# Patient Record
Sex: Male | Born: 1965 | Race: Black or African American | Hispanic: No | Marital: Single | Smoking: Current some day smoker
Health system: Southern US, Community
[De-identification: ages and names within clinical notes are randomized; demographics above are authoritative.]

## PROBLEM LIST (undated history)

## (undated) DIAGNOSIS — E785 Hyperlipidemia, unspecified: Secondary | ICD-10-CM

## (undated) DIAGNOSIS — I10 Essential (primary) hypertension: Secondary | ICD-10-CM

## (undated) DIAGNOSIS — S0990XA Unspecified injury of head, initial encounter: Secondary | ICD-10-CM

## (undated) DIAGNOSIS — Q046 Congenital cerebral cysts: Secondary | ICD-10-CM

## (undated) HISTORY — PX: SHOULDER SURGERY: SHX246

## (undated) HISTORY — PX: TOE FUSION: SHX1070

---

## 2006-11-28 ENCOUNTER — Emergency Department: Payer: Self-pay | Admitting: Emergency Medicine

## 2010-03-19 ENCOUNTER — Emergency Department: Payer: Self-pay | Admitting: Unknown Physician Specialty

## 2011-03-01 ENCOUNTER — Emergency Department: Payer: Self-pay | Admitting: Emergency Medicine

## 2014-06-06 ENCOUNTER — Observation Stay: Payer: Self-pay | Admitting: Urology

## 2014-06-06 LAB — CBC WITH DIFFERENTIAL/PLATELET
BASOS ABS: 0 10*3/uL (ref 0.0–0.1)
Basophil %: 0 %
Eosinophil #: 0 10*3/uL (ref 0.0–0.7)
Eosinophil %: 0.5 %
HCT: 46.3 % (ref 40.0–52.0)
HGB: 14.6 g/dL (ref 13.0–18.0)
Lymphocyte #: 0.4 10*3/uL — ABNORMAL LOW (ref 1.0–3.6)
Lymphocyte %: 3.9 %
MCH: 25.4 pg — ABNORMAL LOW (ref 26.0–34.0)
MCHC: 31.5 g/dL — ABNORMAL LOW (ref 32.0–36.0)
MCV: 81 fL (ref 80–100)
MONOS PCT: 4.4 %
Monocyte #: 0.4 x10 3/mm (ref 0.2–1.0)
Neutrophil #: 8.6 10*3/uL — ABNORMAL HIGH (ref 1.4–6.5)
Neutrophil %: 91.2 %
Platelet: 148 10*3/uL — ABNORMAL LOW (ref 150–440)
RBC: 5.75 10*6/uL (ref 4.40–5.90)
RDW: 15.3 % — AB (ref 11.5–14.5)
WBC: 9.5 10*3/uL (ref 3.8–10.6)

## 2014-06-06 LAB — LIPASE, BLOOD: LIPASE: 67 U/L — AB (ref 73–393)

## 2014-06-06 LAB — URINALYSIS, COMPLETE
Bacteria: NONE SEEN
Bilirubin,UR: NEGATIVE
GLUCOSE, UR: NEGATIVE mg/dL (ref 0–75)
NITRITE: POSITIVE
PH: 8 (ref 4.5–8.0)
Protein: NEGATIVE
RBC,UR: 3 /HPF (ref 0–5)
Specific Gravity: 1.023 (ref 1.003–1.030)
WBC UR: 15 /HPF (ref 0–5)

## 2014-06-06 LAB — COMPREHENSIVE METABOLIC PANEL
ALK PHOS: 83 U/L
ANION GAP: 5 — AB (ref 7–16)
Albumin: 3.9 g/dL (ref 3.4–5.0)
BUN: 12 mg/dL (ref 7–18)
Bilirubin,Total: 0.6 mg/dL (ref 0.2–1.0)
Calcium, Total: 8.6 mg/dL (ref 8.5–10.1)
Chloride: 108 mmol/L — ABNORMAL HIGH (ref 98–107)
Co2: 26 mmol/L (ref 21–32)
Creatinine: 1.21 mg/dL (ref 0.60–1.30)
Glucose: 103 mg/dL — ABNORMAL HIGH (ref 65–99)
Osmolality: 278 (ref 275–301)
Potassium: 4.8 mmol/L (ref 3.5–5.1)
SGOT(AST): 24 U/L (ref 15–37)
SGPT (ALT): 27 U/L (ref 12–78)
Sodium: 139 mmol/L (ref 136–145)
Total Protein: 7.6 g/dL (ref 6.4–8.2)

## 2014-06-06 LAB — TROPONIN I: Troponin-I: <0.02 ng/mL

## 2014-06-08 LAB — URINE CULTURE

## 2015-04-01 NOTE — Op Note (Signed)
PATIENT NAME:  Jesus Hawkins, Jesus Hawkins MR#:  [id number] DATE OF BIRTH:  [birth year]  DATE OF PROCEDURE:  06/06/2014  PREOPERATIVE DIAGNOSES: 1.  Urinary retention.  2.  Urethral stricture disease.   POSTOPERATIVE DIAGNOSES: 1.  Urinary retention.  2.  Urethral stricture disease.   PROCEDURES: 1.  Cystoscopy with visual internal urethrotomy.  2.  Foley catheter placement.   SURGEON: Maryan Puls, M.D.   ANESTHETIST: Dr. Boston Service  ANESTHETIC METHOD: General per Dr. Boston Service and local per Dr. Yves Dill.   INDICATIONS: See the dictated history and physical. After informed consent, the patient requested the above procedure.   OPERATIVE SUMMARY: After adequate general anesthesia had been obtained, the patient was placed into dorsal lithotomy position and the perineum was prepped and draped in the usual fashion. The 21-French cystoscope was coupled with the camera and then visually advanced into the distal urethra. The scope could only be advanced up to the distal bulbar urethra at which point a pinpoint opening was encountered due to presence of a stricture. A 0.035 guidewire was then threaded through the stricture and curled into the bladder. The cystoscope was removed. The visual urethrotome was then coupled with the camera and advanced up to the stricture. Using the urethrotome the stricture was incised at the 12 o'clock position eventually allowing entry into the bladder. The bladder was thoroughly inspected. Both ureteral orifices were identified and had clear efflux. No bladder mucosal lesions were identified. At this point, the urethrotome was removed taking care to leave the guidewire in position. A 20-French Council catheter was then advanced over the guidewire and placed into the bladder. Guidewire was then removed. Catheter had clear drainage. Then 10 mL of viscous Xylocaine was instilled within the urethra around the Foley catheter. B and O suppository was placed. The procedure was then  terminated, and the patient was transferred to the recovery room in stable condition. ____________________________ Otelia Limes. Yves Dill, MD [email or web address]:[id number] D: 06/06/2014 14:49:55 ET T: 06/06/2014 15:04:40 ET JOB#: [id number]  cc: Otelia Limes. Yves Dill, MD, <Dictator> Royston Cowper MD ELECTRONICALLY SIGNED 06/07/2014 8:34

## 2015-04-01 NOTE — Consult Note (Signed)
PATIENT NAME:  Jesus Hawkins, Jesus Hawkins MR#:  [id number] DATE OF BIRTH:  Jan 20, 1966  DATE OF CONSULTATION:  06/06/2014  REFERRING PHYSICIAN:  Emergency Room  CONSULTING PHYSICIAN:  Otelia Limes. Yves Dill, MD  REASON FOR CONSULTATION: Inability to place Foley catheter.   HISTORY OF PRESENT ILLNESS: Jesus Hawkins is a 49 year old [place] male who presented to the ER with suprapubic discomfort and a long history of difficulty voiding. He states that he may have had a Foley catheter placed some time in the past, but does not recall the circumstances around that. He denies prior prostate or urethral surgery. CT scan was performed in the ER revealing distended bladder and thickened bladder wall with possible bladder stone.  ALLERGIES: No drug allergies.   CURRENT MEDICATIONS: Niacin and fish oil.  PAST SURGICAL HISTORY: Repair of a right foot deformity in 1996 and repair of injured right shoulder in 2010.   SOCIAL HISTORY: Smokes a pack a day, has a 40 pack-year history. Consumes 2 to 3 alcoholic beverages per week.   FAMILY HISTORY: Negative for urologic disease and prostate cancer.  PAST AND CURRENT MEDICAL CONDITIONS: The patient denied any significant medical history.  REVIEW OF SYSTEMS: The patient denied heart disease, lung disease, stroke, diabetes or hypertension.   PHYSICAL EXAMINATION: GENERAL: Well-nourished African American male in no distress.  HEENT: Sclerae were clear. Pupils were equally round and reactive to light and accommodation.  NECK: Supple. No palpable cervical adenopathy.  LUNGS: Clear to auscultation.  CARDIOVASCULAR: Regular rhythm and rate without audible murmurs.  ABDOMEN: Soft, nontender abdomen.  GENITOURINARY: Uncircumcised. Testes atrophic, 18 mL in size each.  RECTAL: 15 grams, smooth, nontender prostate.  NEUROMUSCULAR: Alert and oriented x3.   IMPRESSION: 1.  Urinary retention.  2.  Trabeculated bladder with possible bladder stone.   PLAN: 1.  Perineum was prepped and  draped in the usual fashion. Then 20 mL of 1% Xylocaine was instilled within the urethra. After adequate local anesthesia had been obtained, an attempt was made to place an 18-French coude catheter. The patient appeared to have a stricture in the proximal penile urethra. Coude could not be passed beyond this point. At this point, a filiform was placed, but the stricture could not be dilated with followers. The procedure was then terminated.  2.  Will plan to take the patient to the operating room for cystoscopy with internal urethrotomy with catheter placement versus suprapubic catheter placement under anesthesia. ____________________________ Otelia Limes. Yves Dill, MD [email or web address] D: 06/06/2014 08:20:31 ET T: 06/06/2014 08:35:57 ET JOB#: [id number]  cc: Otelia Limes. Yves Dill, MD, <Dictator> Jesus Cowper MD ELECTRONICALLY SIGNED 06/06/2014 13:12

## 2015-10-12 ENCOUNTER — Encounter: Payer: Self-pay | Admitting: *Deleted

## 2015-10-13 ENCOUNTER — Encounter
Admission: RE | Disposition: A | Discharge status: Home / Self Care | Payer: Self-pay | Source: Ambulatory Visit | Attending: Gastroenterology

## 2015-10-13 ENCOUNTER — Ambulatory Visit: Payer: Medicare Other | Admitting: Anesthesiology

## 2015-10-13 ENCOUNTER — Encounter: Payer: Self-pay | Admitting: *Deleted

## 2015-10-13 ENCOUNTER — Ambulatory Visit
Admission: RE | Admit: 2015-10-13 | Discharge: 2015-10-13 | Disposition: A | Discharge status: Home / Self Care | Payer: Medicare Other | Source: Ambulatory Visit | Attending: Gastroenterology | Admitting: Gastroenterology

## 2015-10-13 DIAGNOSIS — K635 Polyp of colon: Secondary | ICD-10-CM | POA: Diagnosis not present

## 2015-10-13 DIAGNOSIS — Z1211 Encounter for screening for malignant neoplasm of colon: Secondary | ICD-10-CM | POA: Insufficient documentation

## 2015-10-13 DIAGNOSIS — I10 Essential (primary) hypertension: Secondary | ICD-10-CM | POA: Diagnosis not present

## 2015-10-13 DIAGNOSIS — Z79899 Other long term (current) drug therapy: Secondary | ICD-10-CM | POA: Insufficient documentation

## 2015-10-13 DIAGNOSIS — D125 Benign neoplasm of sigmoid colon: Secondary | ICD-10-CM | POA: Diagnosis not present

## 2015-10-13 DIAGNOSIS — E785 Hyperlipidemia, unspecified: Secondary | ICD-10-CM | POA: Diagnosis not present

## 2015-10-13 HISTORY — DX: Essential (primary) hypertension: I10

## 2015-10-13 HISTORY — DX: Unspecified injury of head, initial encounter: S09.90XA

## 2015-10-13 HISTORY — DX: Hyperlipidemia, unspecified: E78.5

## 2015-10-13 HISTORY — PX: COLONOSCOPY WITH PROPOFOL: SHX5780

## 2015-10-13 SURGERY — COLONOSCOPY WITH PROPOFOL
Anesthesia: General

## 2015-10-13 MED ORDER — SODIUM CHLORIDE 0.9 % IV SOLN
INTRAVENOUS | Status: DC
  Administered 2015-10-13: 1000 mL via INTRAVENOUS

## 2015-10-13 MED ORDER — PHENYLEPHRINE HCL 10 MG/ML IJ SOLN
INTRAMUSCULAR | Status: DC | PRN
  Administered 2015-10-13: 100 ug via INTRAVENOUS
  Administered 2015-10-13: 50 ug via INTRAVENOUS

## 2015-10-13 MED ORDER — PROPOFOL 500 MG/50ML IV EMUL
INTRAVENOUS | Status: DC | PRN
  Administered 2015-10-13: 160 ug/kg/min via INTRAVENOUS

## 2015-10-13 MED ORDER — MIDAZOLAM HCL 5 MG/5ML IJ SOLN
INTRAMUSCULAR | Status: DC | PRN
  Administered 2015-10-13: 1 mg via INTRAVENOUS

## 2015-10-13 MED ORDER — FENTANYL CITRATE (PF) 100 MCG/2ML IJ SOLN
INTRAMUSCULAR | Status: DC | PRN
Start: 2015-10-13 — End: 2015-10-13
  Administered 2015-10-13: 50 ug via INTRAVENOUS

## 2015-10-13 MED ORDER — SODIUM CHLORIDE 0.9 % IV SOLN: INTRAVENOUS | Status: DC

## 2015-10-13 MED ORDER — PROPOFOL 10 MG/ML IV BOLUS
INTRAVENOUS | Status: DC | PRN
  Administered 2015-10-13: 50 mg via INTRAVENOUS
  Administered 2015-10-13: 25 mg via INTRAVENOUS

## 2015-10-13 NOTE — H&P (Signed)
Outpatient short stay form Pre-procedure 10/13/2015 9:39 AM Lollie Sails MD  Primary Physician: Gwenevere Ghazi M.D., general medical clinic Dr. Herbert Moors  Reason for visit:  Colonoscopy  History of present illness:  Patient is a 49 year old male residing today for screening colonoscopy. He tolerated his prep well. He denies use of any aspirin or blood thinning products.    Current facility-administered medications:  .  0.9 %  sodium chloride infusion, , Intravenous, Continuous, Lollie Sails, MD .  0.9 %  sodium chloride infusion, , Intravenous, Continuous, Lollie Sails, MD, Last Rate: 20 mL/hr at 10/13/15 0916, 1,000 mL at 10/13/15 0916 .  0.9 %  sodium chloride infusion, , Intravenous, Continuous, Lollie Sails, MD  Prescriptions prior to admission  Medication Sig Dispense Refill Last Dose  . amLODipine (NORVASC) 5 MG tablet Take 5 mg by mouth daily.     . niacin 500 MG tablet Take 500 mg by mouth at bedtime.     . OMEGA-3 FATTY ACIDS-VITAMIN E PO Take 1,000 mg by mouth 2 (two) times daily.        No Known Allergies   Past Medical History  Diagnosis Date  . Head injury   . Hyperlipidemia   . Hypertension     Review of systems:      Physical Exam    Heart and lungs: Regular rate and rhythm without rub or gallop, lungs are bilaterally clear    HEENT: Normocephalic atraumatic eyes are anicteric    Other:     Pertinant exam for procedure: Soft nontender nondistended bowel sounds positive normoactive    Planned proceedures: Colonoscopy and indicated procedures. I have discussed the risks benefits and complications of procedures to include not limited to bleeding, infection, perforation and the risk of sedation and the patient wishes to proceed.    Lollie Sails, MD Gastroenterology 10/13/2015  9:39 AM

## 2015-10-13 NOTE — Anesthesia Postprocedure Evaluation (Signed)
  Anesthesia Post-op Note  Patient: Jesus Hawkins  Procedure(s) Performed: Procedure(s): COLONOSCOPY WITH PROPOFOL (N/A)  Anesthesia type:General  Patient location: PACU  Post pain: Pain level controlled  Post assessment: Post-op Vital signs reviewed, Patient's Cardiovascular Status Stable, Respiratory Function Stable, Patent Airway and No signs of Nausea or vomiting  Post vital signs: Reviewed and stable  Last Vitals:  Filed Vitals:   10/13/15 1100  BP: 111/85  Pulse: 68  Temp:   Resp: 16    Level of consciousness: awake, alert  and patient cooperative  Complications: No apparent anesthesia complications

## 2015-10-13 NOTE — Op Note (Signed)
[facility] Gastroenterology Patient Name: Jesus Hawkins Procedure Date: 10/13/2015 9:42 AM MRN: [id number] Account #: [id number] Date of Birth: 29-Aug-1966 Admit Type: Outpatient Age: 49 Room: [facility] ENDO ROOM 3 Gender: Male Note Status: Finalized Procedure:         Colonoscopy Indications:       Screening for colorectal malignant neoplasm Providers:         Lollie Sails, MD Referring MD:      Ardelia Mems. Jimmye Norman (Referring MD) Medicines:         Monitored Anesthesia Care Complications:     No immediate complications. Procedure:         Pre-Anesthesia Assessment:                    - ASA Grade Assessment: III - A patient with severe                     systemic disease.                    After obtaining informed consent, the colonoscope was                     passed under direct vision. Throughout the procedure, the                     patient's blood pressure, pulse, and oxygen saturations                     were monitored continuously. The Colonoscope was                     introduced through the anus and advanced to the the cecum,                     identified by appendiceal orifice and ileocecal valve. The                     quality of the bowel preparation was good. Findings:      A 3 mm polyp was found at the hepatic flexure. The polyp was sessile.       The polyp was removed with a cold biopsy forceps. Resection and       retrieval were complete.      A 2 mm polyp was found in the distal sigmoid colon. The polyp was       sessile. The polyp was removed with a cold biopsy forceps. Resection and       retrieval were complete.      The retroflexed view of the distal rectum and anal verge was normal and       showed no anal or rectal abnormalities.      The digital rectal exam was normal. Impression:        - One 3 mm polyp at the hepatic flexure. Resected and                     retrieved.                    - One 2 mm polyp in the distal sigmoid  colon. Resected and                     retrieved.                    -  The distal rectum and anal verge are normal on                     retroflexion view. Recommendation:    - Telephone GI clinic for pathology results in 1 week.                    - Await pathology results. Procedure Code(s): --- Professional ---                    [phone], Colonoscopy, flexible; with biopsy, single or                     multiple Diagnosis Code(s): --- Professional ---                    V76.51, Special screening for malignant neoplasms of colon                    211.3, Benign neoplasm of colon CPT copyright 2014 American Medical Association. All rights reserved. The codes documented in this report are preliminary and upon coder review may  be revised to meet current compliance requirements. Lollie Sails, MD 10/13/2015 10:23:31 AM This report has been signed electronically. Number of Addenda: 0 Note Initiated On: 10/13/2015 9:42 AM Scope Withdrawal Time: 0 hours 5 minutes 37 seconds  Total Procedure Duration: 0 hours 16 minutes 14 seconds       [facility]

## 2015-10-13 NOTE — Anesthesia Preprocedure Evaluation (Signed)
Anesthesia Evaluation  Patient identified by MRN, date of birth, ID band Patient awake    Reviewed: Allergy & Precautions, H&P , NPO status , Patient's Chart, lab work & pertinent test results  History of Anesthesia Complications Negative for: history of anesthetic complications  Airway Mallampati: II  TM Distance: >3 FB Neck ROM: full    Dental  (+) Poor Dentition, Missing, Upper Dentures   Pulmonary neg shortness of breath, Current Smoker,    Pulmonary exam normal breath sounds clear to auscultation       Cardiovascular Exercise Tolerance: Good hypertension, (-) angina(-) DOE Normal cardiovascular exam(-) Valvular Problems/Murmurs Rhythm:regular Rate:Normal     Neuro/Psych negative neurological ROS  negative psych ROS   GI/Hepatic negative GI ROS, Neg liver ROS, neg GERD  ,  Endo/Other  negative endocrine ROS  Renal/GU negative Renal ROS  negative genitourinary   Musculoskeletal   Abdominal   Peds  Hematology negative hematology ROS (+)   Anesthesia Other Findings Past Medical History:   Head injury                                                  Hyperlipidemia                                               Hypertension                                                Past Surgical History:   SHOULDER SURGERY                                Right              TOE FUSION                                                   BMI    Body Mass Index   23.09 kg/m 2    Weakness on right side of body from CP  Reproductive/Obstetrics negative OB ROS                             Anesthesia Physical Anesthesia Plan  ASA: III  Anesthesia Plan: General   Post-op Pain Management:    Induction:   Airway Management Planned:   Additional Equipment:   Intra-op Plan:   Post-operative Plan:   Informed Consent: I have reviewed the patients History and Physical, chart, labs and discussed the  procedure including the risks, benefits and alternatives for the proposed anesthesia with the patient or authorized representative who has indicated his/her understanding and acceptance.   Dental Advisory Given  Plan Discussed with: Anesthesiologist, CRNA and Surgeon  Anesthesia Plan Comments:         Anesthesia Quick Evaluation

## 2015-10-13 NOTE — Transfer of Care (Cosign Needed)
Immediate Anesthesia Transfer of Care Note  Patient: Jesus Hawkins  Procedure(s) Performed: Procedure(s): COLONOSCOPY WITH PROPOFOL (N/A)  Patient Location: PACU and Endoscopy Unit  Anesthesia Type:General  Level of Consciousness: sedated  Airway & Oxygen Therapy: Patient Spontanous Breathing and Patient connected to nasal cannula oxygen  Post-op Assessment: Report given to RN and Post -op Vital signs reviewed and stable  Post vital signs: Reviewed and stable  Last Vitals:  Filed Vitals:   10/13/15 1029  BP:   Pulse:   Temp: 36.3 C  Resp:     Complications: No apparent anesthesia complications

## 2015-10-16 ENCOUNTER — Encounter: Payer: Self-pay | Admitting: Gastroenterology

## 2015-10-16 LAB — SURGICAL PATHOLOGY

## 2017-06-07 ENCOUNTER — Emergency Department
Admission: EM | Admit: 2017-06-07 | Discharge: 2017-06-07 | Disposition: A | Discharge status: Home / Self Care | Payer: Medicare Other | Attending: Emergency Medicine | Admitting: Emergency Medicine

## 2017-06-07 DIAGNOSIS — H9201 Otalgia, right ear: Secondary | ICD-10-CM | POA: Diagnosis present

## 2017-06-07 DIAGNOSIS — Z5321 Procedure and treatment not carried out due to patient leaving prior to being seen by health care provider: Secondary | ICD-10-CM | POA: Insufficient documentation

## 2017-06-07 NOTE — ED Triage Notes (Signed)
Pt states he thinks he has a bug in his right ear. Pt with some sanginous drainage in right ear. Pt does not appear to be in any acute distress.

## 2017-06-07 NOTE — ED Notes (Signed)
Pt states he is leaving "the bug came out".

## 2019-01-04 ENCOUNTER — Emergency Department: Payer: Medicare Other

## 2019-01-04 ENCOUNTER — Other Ambulatory Visit: Payer: Self-pay

## 2019-01-04 ENCOUNTER — Emergency Department
Admission: EM | Admit: 2019-01-04 | Discharge: 2019-01-04 | Disposition: A | Discharge status: Home / Self Care | Payer: Medicare Other | Attending: Emergency Medicine | Admitting: Emergency Medicine

## 2019-01-04 DIAGNOSIS — F17210 Nicotine dependence, cigarettes, uncomplicated: Secondary | ICD-10-CM | POA: Insufficient documentation

## 2019-01-04 DIAGNOSIS — I10 Essential (primary) hypertension: Secondary | ICD-10-CM | POA: Diagnosis not present

## 2019-01-04 DIAGNOSIS — N390 Urinary tract infection, site not specified: Secondary | ICD-10-CM | POA: Insufficient documentation

## 2019-01-04 DIAGNOSIS — Z79899 Other long term (current) drug therapy: Secondary | ICD-10-CM | POA: Diagnosis not present

## 2019-01-04 DIAGNOSIS — R51 Headache: Secondary | ICD-10-CM | POA: Diagnosis present

## 2019-01-04 DIAGNOSIS — R42 Dizziness and giddiness: Secondary | ICD-10-CM | POA: Insufficient documentation

## 2019-01-04 LAB — GLUCOSE, CAPILLARY: GLUCOSE-CAPILLARY: 106 mg/dL — AB (ref 70–99)

## 2019-01-04 LAB — CBC
HCT: 44.6 % (ref 39.0–52.0)
Hemoglobin: 14.5 g/dL (ref 13.0–17.0)
MCH: 25.6 pg — ABNORMAL LOW (ref 26.0–34.0)
MCHC: 32.5 g/dL (ref 30.0–36.0)
MCV: 78.8 fL — ABNORMAL LOW (ref 80.0–100.0)
Platelets: 206 10*3/uL (ref 150–400)
RBC: 5.66 MIL/uL (ref 4.22–5.81)
RDW: 15 % (ref 11.5–15.5)
WBC: 7.6 10*3/uL (ref 4.0–10.5)
nRBC: 0 % (ref 0.0–0.2)

## 2019-01-04 LAB — ETHANOL: Alcohol, Ethyl (B): <10 mg/dL (ref <10)

## 2019-01-04 LAB — BASIC METABOLIC PANEL
Anion gap: 5 (ref 5–15)
BUN: 15 mg/dL (ref 6–20)
CO2: 25 mmol/L (ref 22–32)
Calcium: 9.3 mg/dL (ref 8.9–10.3)
Chloride: 107 mmol/L (ref 98–111)
Creatinine, Ser: 1.1 mg/dL (ref 0.61–1.24)
GFR calc Af Amer: >60 mL/min (ref >60)
GFR calc non Af Amer: >60 mL/min (ref >60)
Glucose, Bld: 135 mg/dL — ABNORMAL HIGH (ref 70–99)
POTASSIUM: 5.1 mmol/L (ref 3.5–5.1)
Sodium: 137 mmol/L (ref 135–145)

## 2019-01-04 LAB — URINALYSIS, COMPLETE (UACMP) WITH MICROSCOPIC
Bilirubin Urine: NEGATIVE
Glucose, UA: NEGATIVE mg/dL
KETONES UR: NEGATIVE mg/dL
Nitrite: POSITIVE — AB
Protein, ur: NEGATIVE mg/dL
Specific Gravity, Urine: 1.011 (ref 1.005–1.030)
WBC, UA: >50 WBC/hpf — ABNORMAL HIGH (ref 0–5)
pH: 8 (ref 5.0–8.0)

## 2019-01-04 LAB — URINE DRUG SCREEN, QUALITATIVE (ARMC ONLY)
AMPHETAMINES, UR SCREEN: NOT DETECTED
BENZODIAZEPINE, UR SCRN: NOT DETECTED
Barbiturates, Ur Screen: NOT DETECTED
Cannabinoid 50 Ng, Ur ~~LOC~~: POSITIVE — AB
Cocaine Metabolite,Ur ~~LOC~~: NOT DETECTED
MDMA (Ecstasy)Ur Screen: NOT DETECTED
Methadone Scn, Ur: NOT DETECTED
Opiate, Ur Screen: NOT DETECTED
Phencyclidine (PCP) Ur S: NOT DETECTED
Tricyclic, Ur Screen: NOT DETECTED

## 2019-01-04 LAB — INFLUENZA PANEL BY PCR (TYPE A & B)
Influenza A By PCR: NEGATIVE
Influenza B By PCR: NEGATIVE

## 2019-01-04 MED ORDER — CEPHALEXIN 500 MG PO CAPS: 500.0000 mg | ORAL_CAPSULE | Freq: Two times a day (BID) | ORAL | 0 refills | Status: AC

## 2019-01-04 MED ORDER — CEPHALEXIN 500 MG PO CAPS
500.0000 mg | ORAL_CAPSULE | Freq: Once | ORAL | Status: AC
  Administered 2019-01-04: 500 mg via ORAL
  Filled 2019-01-04: qty 1

## 2019-01-04 MED ORDER — SODIUM CHLORIDE 0.9 % IV BOLUS
500.0000 mL | Freq: Once | INTRAVENOUS | Status: AC
  Administered 2019-01-04: 500 mL via INTRAVENOUS

## 2019-01-04 MED ORDER — METOCLOPRAMIDE HCL 5 MG/ML IJ SOLN
10.0000 mg | Freq: Once | INTRAMUSCULAR | Status: AC
  Administered 2019-01-04: 10 mg via INTRAVENOUS
  Filled 2019-01-04: qty 2

## 2019-01-04 NOTE — ED Notes (Signed)
Pt non cooperative with answering questions. Mumbles in low voice when asking for any prior medical hx of why he is here.

## 2019-01-04 NOTE — ED Provider Notes (Signed)
[facility] Emergency Department Provider Note ____________________________________________   First MD Initiated Contact with Patient 01/04/19 [id number]     (approximate)  I have reviewed the triage vital signs and the nursing notes.   HISTORY  Chief Complaint Dizziness; Weakness; and Headache  Level 5 caveat: History of present illness limited due to poor historian  HPI MONTERIO BOB is a 53 y.o. male with PMH as noted below who presents with generalized weakness and dizziness as well as a frontal headache.  The onset was last night but it worsened this morning.  Per EMS the patient went to his neighbor's house and had his neighbor call EMS to come.  The patient reports some nausea and vomited twice but denies fever.  He denies any alcohol or drug use.   Past Medical History:  Diagnosis Date  . Head injury   . Hyperlipidemia   . Hypertension     There are no active problems to display for this patient.   Past Surgical History:  Procedure Laterality Date  . COLONOSCOPY WITH PROPOFOL N/A 10/13/2015   Procedure: COLONOSCOPY WITH PROPOFOL;  Surgeon: Lollie Sails, MD;  Location: [facility] ENDOSCOPY;  Service: Endoscopy;  Laterality: N/A;  . SHOULDER SURGERY Right   . TOE FUSION      Prior to Admission medications   Medication Sig Start Date End Date Taking? Authorizing Provider  atorvastatin (LIPITOR) 10 MG tablet Take 10 mg by mouth daily. 12/27/18  Yes [provider]  meloxicam (MOBIC) 15 MG tablet Take 15 mg by mouth daily. 10/13/18  Yes [provider]  niacin 500 MG tablet Take 500 mg by mouth daily.    Yes [provider]  OMEGA-3 FATTY ACIDS-VITAMIN E PO Take 1,000 mg by mouth 2 (two) times daily.   Yes [provider]  amLODipine (NORVASC) 5 MG tablet Take 5 mg by mouth daily.    [provider]  cephALEXin (KEFLEX) 500 MG capsule Take 1 capsule (500 mg total) by mouth 2 (two) times daily for 10 days.  01/04/19 01/14/19  Arta Silence, MD    Allergies Patient has no known allergies.  No family history on file.  Social History Social History   Tobacco Use  . Smoking status: Current Some Day Smoker    Packs/day: 1.00    Years: 10.00    Pack years: 10.00  . Smokeless tobacco: Never Used  Substance Use Topics  . Alcohol use: Yes    Alcohol/week: 3.0 standard drinks    Types: 3 Cans of beer per week  . Drug use: No    Review of Systems Level 5 caveat: Review of systems limited due to poor historian Constitutional: No fever.  Positive for weakness. Cardiovascular: Denies chest pain. Respiratory: Denies shortness of breath. Gastrointestinal: Positive for nausea. Skin: Negative for rash. Neurological: Positive for headache.   ____________________________________________   PHYSICAL EXAM:  VITAL SIGNS: ED Triage Vitals  Enc Vitals Group     BP 01/04/19 0857 130/75     Pulse Rate 01/04/19 0857 65     Resp 01/04/19 0857 12     Temp 01/04/19 0857 97.8 F (36.6 C)     Temp Source 01/04/19 0857 Oral     SpO2 01/04/19 0857 98 %     Weight 01/04/19 0854 200 lb (90.7 kg)     Height 01/04/19 0854 6' (1.829 m)     Head Circumference --      Peak Flow --  Pain Score --      Pain Loc --      Pain Edu? --      Excl. in [place]? --     Constitutional: Somnolent appearing but arousable.  No acute distress. Eyes: Conjunctivae are normal.  EOMI.  PERRLA. Head: Atraumatic. Nose: No congestion/rhinnorhea. Mouth/Throat: Mucous membranes are dry.   Neck: Normal range of motion.  Cardiovascular: Normal rate, regular rhythm. Grossly normal heart sounds.  Good peripheral circulation. Respiratory: Normal respiratory effort.  No retractions. Lungs CTAB. Gastrointestinal: Soft with minimal diffuse discomfort but no focal tenderness. No distention.  Genitourinary: No flank tenderness. Musculoskeletal: Extremities warm and well perfused.  Neurologic: Motor intact in all extremities  with some right-sided weakness which is baseline for the patient.  Normal coordination. Skin:  Skin is warm and dry. No rash noted. Psychiatric: Calm and cooperative.  ____________________________________________   LABS (all labs ordered are listed, but only abnormal results are displayed)  Labs Reviewed  GLUCOSE, CAPILLARY - Abnormal; Notable for the following components:      Result Value   Glucose-Capillary 106 (*)    All other components within normal limits  BASIC METABOLIC PANEL - Abnormal; Notable for the following components:   Glucose, Bld 135 (*)    All other components within normal limits  CBC - Abnormal; Notable for the following components:   MCV 78.8 (*)    MCH 25.6 (*)    All other components within normal limits  URINALYSIS, COMPLETE (UACMP) WITH MICROSCOPIC - Abnormal; Notable for the following components:   Color, Urine YELLOW (*)    APPearance CLOUDY (*)    Hgb urine dipstick SMALL (*)    Nitrite POSITIVE (*)    Leukocytes, UA SMALL (*)    WBC, UA >50 (*)    Bacteria, UA RARE (*)    All other components within normal limits  URINE DRUG SCREEN, QUALITATIVE ([facility] ONLY) - Abnormal; Notable for the following components:   Cannabinoid 50 Ng, Ur [place] POSITIVE (*)    All other components within normal limits  INFLUENZA PANEL BY PCR (TYPE A & B)  ETHANOL  CBG MONITORING, ED   ____________________________________________  EKG  ED ECG REPORT I, Arta Silence, the attending physician, personally viewed and interpreted this ECG.  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  53 year old male with PMH as noted above presents with weakness, nausea and vomiting, and frontal headache which she states started last night but worsened this morning.  Per EMS he went to his neighbor's house and called them.  He did not give much additional information en route.  On exam the patient appears somnolent but is arousable.  He is lying in the stretcher with his eyes closed although he is able to answer my questions.  He was able to comply with most of the exam; he appeared to give a  poor effort when squeezing hands, lifting his legs, or opening his eyes, but was able to do all of these things when I encouraged him.  His neuro exam is nonfocal except for chronic right-sided weakness.  There is no evidence of trauma.  The abdomen is soft with mild discomfort but no focal tenderness.  Overall the differential is not clear.  I suspect most likely benign etiology such as influenza or other viral syndrome, gastritis, or less likely other acute infection.  I have a low suspicion for CNS cause given that the patient's exam is nonfocal.  However given the difficulty in obtaining reliable history and exam we will obtain CT head, lab work-up, and then reassess.  ----------------------------------------- 12:11 PM on 01/04/2019 -----------------------------------------  The patient's work-up reveals findings consistent with a UTI, and on further questioning the patient does report that he has urinary urgency and frequency especially at night which has escalated recently.  The  remainder of his work-up is unremarkable including negative influenza.  His vital signs are stable.  On reassessment, he is much more alert and is now conversing normally.  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  CT shows no acute findings.  At this time, the patient is stable for discharge home and he is feeling well and would like to go home.  I counseled him on the results of the work-up.  We will start him on Keflex for empiric treatment of UTI.  The patient agrees with this plan.  Return precautions given, and he expresses understanding. ____________________________________________   FINAL CLINICAL IMPRESSION(S) / ED DIAGNOSES  Final diagnoses:  Urinary tract infection without hematuria, site unspecified      NEW MEDICATIONS STARTED DURING THIS VISIT:  New Prescriptions   CEPHALEXIN (KEFLEX) 500 MG CAPSULE    Take 1 capsule (500 mg total) by mouth 2 (two) times daily for 10 days.     Note:  This document was prepared using Dragon voice recognition software and may include unintentional dictation errors.    Arta Silence, MD 01/04/19 1213

## 2019-01-04 NOTE — ED Notes (Signed)
Patient transported to CT 

## 2019-01-04 NOTE — ED Notes (Signed)
Report to Sam, RN  

## 2019-01-04 NOTE — ED Triage Notes (Signed)
C/o dizziness, weakness and headache since last night. VSS.

## 2019-01-04 NOTE — Discharge Instructions (Addendum)
Your work-up shows a likely urinary tract infection.  Take the antibiotic as prescribed and finish the full course.  Return to the ER for new, worsening, or persistent weakness, lightheadedness, headache, fever, vomiting, or if you cannot take the medication.  Follow-up with your regular doctor.

## 2019-01-19 ENCOUNTER — Encounter: Payer: Self-pay | Admitting: *Deleted

## 2019-01-19 ENCOUNTER — Emergency Department
Admission: EM | Admit: 2019-01-19 | Discharge: 2019-01-20 | Disposition: A | Discharge status: Home / Self Care | Payer: Medicare Other | Attending: Emergency Medicine | Admitting: Emergency Medicine

## 2019-01-19 ENCOUNTER — Other Ambulatory Visit: Payer: Self-pay

## 2019-01-19 DIAGNOSIS — R110 Nausea: Secondary | ICD-10-CM | POA: Insufficient documentation

## 2019-01-19 DIAGNOSIS — R51 Headache: Secondary | ICD-10-CM | POA: Insufficient documentation

## 2019-01-19 DIAGNOSIS — I10 Essential (primary) hypertension: Secondary | ICD-10-CM | POA: Insufficient documentation

## 2019-01-19 DIAGNOSIS — Z79899 Other long term (current) drug therapy: Secondary | ICD-10-CM | POA: Diagnosis not present

## 2019-01-19 DIAGNOSIS — F17210 Nicotine dependence, cigarettes, uncomplicated: Secondary | ICD-10-CM | POA: Insufficient documentation

## 2019-01-19 DIAGNOSIS — R519 Headache, unspecified: Secondary | ICD-10-CM

## 2019-01-19 LAB — CBC
HCT: 40.7 % (ref 39.0–52.0)
Hemoglobin: 13.4 g/dL (ref 13.0–17.0)
MCH: 25.5 pg — ABNORMAL LOW (ref 26.0–34.0)
MCHC: 32.9 g/dL (ref 30.0–36.0)
MCV: 77.5 fL — ABNORMAL LOW (ref 80.0–100.0)
NRBC: 0 % (ref 0.0–0.2)
Platelets: 182 10*3/uL (ref 150–400)
RBC: 5.25 MIL/uL (ref 4.22–5.81)
RDW: 14.7 % (ref 11.5–15.5)
WBC: 7.9 10*3/uL (ref 4.0–10.5)

## 2019-01-19 LAB — COMPREHENSIVE METABOLIC PANEL
ALT: 50 U/L — ABNORMAL HIGH (ref 0–44)
AST: 37 U/L (ref 15–41)
Albumin: 4.4 g/dL (ref 3.5–5.0)
Alkaline Phosphatase: 72 U/L (ref 38–126)
Anion gap: 6 (ref 5–15)
BUN: 17 mg/dL (ref 6–20)
CO2: 26 mmol/L (ref 22–32)
Calcium: 9 mg/dL (ref 8.9–10.3)
Chloride: 104 mmol/L (ref 98–111)
Creatinine, Ser: 1.09 mg/dL (ref 0.61–1.24)
GFR calc Af Amer: >60 mL/min (ref >60)
GFR calc non Af Amer: >60 mL/min (ref >60)
Glucose, Bld: 134 mg/dL — ABNORMAL HIGH (ref 70–99)
POTASSIUM: 3.6 mmol/L (ref 3.5–5.1)
SODIUM: 136 mmol/L (ref 135–145)
Total Bilirubin: 0.4 mg/dL (ref 0.3–1.2)
Total Protein: 7.2 g/dL (ref 6.5–8.1)

## 2019-01-19 LAB — LIPASE, BLOOD: Lipase: 25 U/L (ref 11–51)

## 2019-01-19 MED ORDER — ONDANSETRON HCL 4 MG/2ML IJ SOLN
INTRAMUSCULAR | Status: AC
  Filled 2019-01-19: qty 2

## 2019-01-19 MED ORDER — SODIUM CHLORIDE 0.9% FLUSH
3.0000 mL | Freq: Once | INTRAVENOUS | Status: AC
  Administered 2019-01-19: 3 mL via INTRAVENOUS

## 2019-01-19 MED ORDER — ONDANSETRON HCL 4 MG/2ML IJ SOLN
4.0000 mg | Freq: Once | INTRAMUSCULAR | Status: AC
  Administered 2019-01-19: 4 mg via INTRAVENOUS

## 2019-01-19 NOTE — ED Triage Notes (Signed)
Pt to ED from home reporting vomiting that started today, dizziness and chills. No fever noted in triage but pt reports a fever at home today. Pt reports having taken a tylenol at ome today. Pt slow to respond in triage and holding emesis bag over face the entire time. 1 episode of diarrhea also reported.

## 2019-01-20 DIAGNOSIS — R51 Headache: Secondary | ICD-10-CM | POA: Diagnosis not present

## 2019-01-20 LAB — INFLUENZA PANEL BY PCR (TYPE A & B)
Influenza A By PCR: NEGATIVE
Influenza B By PCR: NEGATIVE

## 2019-01-20 MED ORDER — BUTALBITAL-APAP-CAFFEINE 50-325-40 MG PO TABS: 1.0000 | ORAL_TABLET | Freq: Four times a day (QID) | ORAL | 0 refills | Status: AC | PRN

## 2019-01-20 MED ORDER — METOCLOPRAMIDE HCL 5 MG/ML IJ SOLN
10.0000 mg | Freq: Once | INTRAMUSCULAR | Status: AC
  Administered 2019-01-20: 10 mg via INTRAVENOUS
  Filled 2019-01-20: qty 2

## 2019-01-20 MED ORDER — KETOROLAC TROMETHAMINE 30 MG/ML IJ SOLN
30.0000 mg | Freq: Once | INTRAMUSCULAR | Status: AC
  Administered 2019-01-20: 30 mg via INTRAVENOUS
  Filled 2019-01-20: qty 1

## 2019-01-20 NOTE — ED Provider Notes (Signed)
[facility] Emergency Department Provider Note    First MD Initiated Contact with Patient 01/19/19 2351     (approximate)  I have reviewed the triage vital signs and the nursing notes.   HISTORY  Chief Complaint Emesis and Dizziness    HPI Jesus Hawkins is a 53 y.o. male with below list of chronic medical conditions including head injury with chronic headache to the emergency department with current 5 out of 10 headache.  Patient denies any weakness numbness gait instability or visual changes.  Patient states that headache is consistent with previous headaches.  Patient does admit to nausea earlier however now resolved.   Past Medical History:  Diagnosis Date  . Head injury   . Hyperlipidemia   . Hypertension     There are no active problems to display for this patient.   Past Surgical History:  Procedure Laterality Date  . COLONOSCOPY WITH PROPOFOL N/A 10/13/2015   Procedure: COLONOSCOPY WITH PROPOFOL;  Surgeon: Lollie Sails, MD;  Location: [facility] ENDOSCOPY;  Service: Endoscopy;  Laterality: N/A;  . SHOULDER SURGERY Right   . TOE FUSION      Prior to Admission medications   Medication Sig Start Date End Date Taking? Authorizing Provider  amLODipine (NORVASC) 5 MG tablet Take 5 mg by mouth daily.    [provider]  atorvastatin (LIPITOR) 10 MG tablet Take 10 mg by mouth daily. 12/27/18   [provider]  meloxicam (MOBIC) 15 MG tablet Take 15 mg by mouth daily. 10/13/18   [provider]  niacin 500 MG tablet Take 500 mg by mouth daily.     [provider]  OMEGA-3 FATTY ACIDS-VITAMIN E PO Take 1,000 mg by mouth 2 (two) times daily.    [provider]    Allergies Patient has no known allergies.  History reviewed. No pertinent family history.  Social History Social History   Tobacco Use  . Smoking status: Current Some Day Smoker    Packs/day: 1.00    Years: 10.00    Pack years: 10.00  .  Smokeless tobacco: Never Used  Substance Use Topics  . Alcohol use: Yes    Alcohol/week: 3.0 standard drinks    Types: 3 Cans of beer per week  . Drug use: No    Review of Systems Constitutional: No fever/chills Eyes: No visual changes. ENT: No sore throat. Cardiovascular: Denies chest pain. Respiratory: Denies shortness of breath. Gastrointestinal: No abdominal pain.  No nausea, no vomiting.  No diarrhea.  No constipation. Genitourinary: Negative for dysuria. Musculoskeletal: Negative for neck pain.  Negative for back pain. Integumentary: Negative for rash. Neurological: Positive for headaches, negative for focal weakness or numbness.   ____________________________________________   PHYSICAL EXAM:  VITAL SIGNS: ED Triage Vitals [01/19/19 2135]  Enc Vitals Group     BP 124/88     Pulse Rate 71     Resp 16     Temp 97.9 F (36.6 C)     Temp Source Oral     SpO2 100 %     Weight 90.7 kg (200 lb)     Height 1.829 m (6')     Head Circumference      Peak Flow      Pain Score 10     Pain Loc      Pain Edu?      Excl. in [place]?     Constitutional: Alert and oriented. Well appearing and in no acute distress. Eyes:  Conjunctivae are normal. PERRL. EOMI. Head: Atraumatic. Ears:  Healthy appearing ear canals and TMs bilaterally Nose: No congestion/rhinnorhea. Mouth/Throat: Mucous membranes are moist.  Oropharynx non-erythematous. Neck: No stridor.  Cardiovascular: Normal rate, regular rhythm. Good peripheral circulation. Grossly normal heart sounds. Respiratory: Normal respiratory effort.  No retractions. Lungs CTAB. Gastrointestinal: Soft and nontender. No distention.  Musculoskeletal: No lower extremity tenderness nor edema. No gross deformities of extremities. Neurologic:  Normal speech and language. No gross focal neurologic deficits are appreciated.  Skin:  Skin is warm, dry and intact. No rash noted. Psychiatric: Mood and affect are normal. Speech and behavior are  normal.  ____________________________________________   LABS (all labs ordered are listed, but only abnormal results are displayed)  Labs Reviewed  COMPREHENSIVE METABOLIC PANEL - Abnormal; Notable for the following components:      Result Value   Glucose, Bld 134 (*)    ALT 50 (*)    All other components within normal limits  CBC - Abnormal; Notable for the following components:   MCV 77.5 (*)    MCH 25.5 (*)    All other components within normal limits  LIPASE, BLOOD  INFLUENZA PANEL BY PCR (TYPE A & B)  URINALYSIS, COMPLETE (UACMP) WITH MICROSCOPIC  CBG MONITORING, ED     Procedures   ____________________________________________   INITIAL IMPRESSION / ASSESSMENT AND PLAN / ED COURSE  As part of my medical decision making, I reviewed the following data within the electronic MEDICAL RECORD [id number] year old male presented with above-stated history and physical exam of headache consistent with previous headaches.  Patient had a CT scan done on January 27 which revealed chronic findings nothing acute.  Given that patient has no focal neurological deficits repeats scan not performed.  Patient given Toradol and Reglan in the emergency department will be prescribed Fioricet for home with recommendation to follow-up with primary care provider. ____________________________________________  FINAL CLINICAL IMPRESSION(S) / ED DIAGNOSES  Final diagnoses:  None     MEDICATIONS GIVEN DURING THIS VISIT:  Medications  sodium chloride flush (NS) 0.9 % injection 3 mL (3 mLs Intravenous Given 01/19/19 2144)  ondansetron (ZOFRAN) injection 4 mg (4 mg Intravenous Given 01/19/19 2144)  ketorolac (TORADOL) 30 MG/ML injection 30 mg (30 mg Intravenous Given 01/20/19 0034)  metoCLOPramide (REGLAN) injection 10 mg (10 mg Intravenous Given 01/20/19 0034)     ED Discharge Orders    None       Note:  This document was prepared using Dragon voice recognition software and may include unintentional  dictation errors.   Gregor Hams, MD 01/20/19 [id number]

## 2019-02-17 ENCOUNTER — Other Ambulatory Visit: Payer: Self-pay

## 2019-02-17 ENCOUNTER — Encounter: Payer: Self-pay | Admitting: Urology

## 2019-02-17 ENCOUNTER — Ambulatory Visit (INDEPENDENT_AMBULATORY_CARE_PROVIDER_SITE_OTHER): Payer: Medicare Other | Admitting: Urology

## 2019-02-17 VITALS — BP 126/73 | HR 87 | Ht 72.0 in | Wt 208.0 lb

## 2019-02-17 DIAGNOSIS — N401 Enlarged prostate with lower urinary tract symptoms: Secondary | ICD-10-CM

## 2019-02-17 DIAGNOSIS — R3914 Feeling of incomplete bladder emptying: Secondary | ICD-10-CM

## 2019-02-17 DIAGNOSIS — N35912 Unspecified bulbous urethral stricture, male: Secondary | ICD-10-CM | POA: Diagnosis not present

## 2019-02-17 DIAGNOSIS — R3129 Other microscopic hematuria: Secondary | ICD-10-CM

## 2019-02-17 LAB — MICROSCOPIC EXAMINATION

## 2019-02-17 LAB — URINALYSIS, COMPLETE
Bilirubin, UA: NEGATIVE
Glucose, UA: NEGATIVE
Ketones, UA: NEGATIVE
Nitrite, UA: POSITIVE — AB
Specific Gravity, UA: 1.02 (ref 1.005–1.030)
Urobilinogen, Ur: 0.2 mg/dL (ref 0.2–1.0)
pH, UA: 7.5 (ref 5.0–7.5)

## 2019-02-17 LAB — BLADDER SCAN AMB NON-IMAGING: Scan Result: >671

## 2019-02-17 MED ORDER — TAMSULOSIN HCL 0.4 MG PO CAPS: 0.4000 mg | ORAL_CAPSULE | Freq: Every day | ORAL | 11 refills | Status: DC

## 2019-02-17 NOTE — Progress Notes (Signed)
02/17/2019 8:47 AM   Jesus Hawkins 1966/08/10 [id number]  Referring provider: Tracie Harrier, MD [address] [facility] [place], [place] [address]  Chief Complaint  Patient presents with  . Hematuria    HPI:  Jesus Hawkins is a 53 yo male referred for microscopic hematuria.  Urinalysis January 11, 2019 revealed 10-50 red blood cells per high-powered field.  He's had no gross hematuria. He is a smoker. Urine cx mixed growth Jul 2019.   He doesn't completely empty his bladder. CT scan from 2015 revealed a distended bladder with an irregular appearing wall such as cellules or trabeculation. Prostate mild BPH. He underwent DVIU with Dr. Yves Dill for a distal bulbar urethra pinpoint opening.   His February 2020 BUN was 17, creatinine 1.09 with a GFR greater than 60. PVR today is 671 ml. He voids sometimes frequently. He strains sometimes strains to void and has a weak stream.   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 As a result his right side is weaker.   Modifying factors: There are no other modifying factors  Associated signs and symptoms: There are no other associated signs and symptoms Aggravating and relieving factors: There are no other aggravating or relieving factors Severity: Moderate Duration: Persistent    PMH: Past Medical History:  Diagnosis Date  . Head injury   . Hyperlipidemia   . Hypertension     Surgical History: Past Surgical History:  Procedure Laterality Date  . COLONOSCOPY WITH PROPOFOL N/A 10/13/2015   Procedure: COLONOSCOPY WITH PROPOFOL;  Surgeon: Lollie Sails, MD;  Location: [facility] ENDOSCOPY;  Service: Endoscopy;  Laterality: N/A;  . SHOULDER SURGERY Right   . TOE FUSION      Home Medications:  Allergies as of 02/17/2019   No Known Allergies     Medication List       Accurate as of February 17, 2019  8:47 AM. Always use your most  recent med list.        amLODipine 5 MG tablet Commonly known as:  NORVASC Take 5 mg by mouth daily.   atorvastatin 10 MG tablet Commonly known as:  LIPITOR Take 10 mg by mouth daily.   butalbital-acetaminophen-caffeine 50-325-40 MG tablet Commonly known as:  FIORICET, ESGIC Take 1 tablet by mouth every 6 (six) hours as needed for headache.   meloxicam 15 MG tablet Commonly known as:  MOBIC Take 15 mg by mouth daily.   niacin 500 MG tablet Take 500 mg by mouth daily.   OMEGA-3 FATTY ACIDS-VITAMIN E PO Take 1,000 mg by mouth 2 (two) times daily.       Allergies: No Known Allergies  Family History: History reviewed. No pertinent family history.  Social History:  reports that he has been smoking. He has a 10.00 pack-year smoking history. He has never used smokeless tobacco. He reports current alcohol use of about 3.0 standard drinks of alcohol per week. He reports that he does not use drugs.  ROS: UROLOGY Frequent Urination?: No Hard to postpone urination?: No Burning/pain with urination?: No Get up at night to urinate?: No Leakage of urine?: No Urine stream starts and stops?: Yes Trouble starting stream?: Yes Do you have to strain to urinate?: No Blood in urine?: No Urinary tract infection?: No Sexually transmitted disease?: No Injury to kidneys or bladder?: No Painful intercourse?: No Weak stream?: Yes Erection problems?: No Penile pain?: No  Gastrointestinal Nausea?: No Vomiting?: No Indigestion/heartburn?: No  Diarrhea?: No Constipation?: No  Constitutional Fever: No Night sweats?: No Weight loss?: No Fatigue?: No  Skin Skin rash/lesions?: No Itching?: No  Eyes Blurred vision?: No Double vision?: No  Ears/Nose/Throat Sore throat?: No Sinus problems?: No  Hematologic/Lymphatic Swollen glands?: No Easy bruising?: No  Cardiovascular Leg swelling?: No Chest pain?: No  Respiratory Cough?: No Shortness of breath?: No  Endocrine  Excessive thirst?: Yes  Musculoskeletal Back pain?: No Joint pain?: No  Neurological Headaches?: No Dizziness?: No  Psychologic Depression?: No Anxiety?: No  Physical Exam: BP 126/73 (BP Location: Left Arm, Patient Position: Sitting, Cuff Size: Normal)   Pulse 87   Ht 6' (1.829 m)   Wt 94.3 kg   BMI 28.21 kg/m   Constitutional:  Alert and oriented, No acute distress. HEENT: [place] AT, moist mucus membranes.  Trachea midline, no masses. Cardiovascular: No clubbing, cyanosis, or edema. Respiratory: Normal respiratory effort, no increased work of breathing. GI: Abdomen is soft, nontender, nondistended, no abdominal masses GU: No CVA tenderness Lymph: No cervical or inguinal lymphadenopathy. Skin: No rashes, bruises or suspicious lesions. Neurologic: Grossly intact, no focal deficits, moving all 4 extremities. Psychiatric: Normal mood and affect. GU: uncircumcised penis, normal foreskin, no phimosis. Scrotum normal, testicles palpably normal, left atrophy / smaller.  DRE: prostate smooth, no hard area or nodule, 50 g   Laboratory Data: Lab Results  Component Value Date   WBC 7.9 01/19/2019   HGB 13.4 01/19/2019   HCT 40.7 01/19/2019   MCV 77.5 (L) 01/19/2019   PLT 182 01/19/2019    Lab Results  Component Value Date   CREATININE 1.09 01/19/2019    No results found for: PSA  No results found for: TESTOSTERONE  No results found for: HGBA1C  Urinalysis    Component Value Date/Time   COLORURINE YELLOW (A) 01/04/2019 0900   APPEARANCEUR CLOUDY (A) 01/04/2019 0900   APPEARANCEUR Hazy 06/06/2014 0554   LABSPEC 1.011 01/04/2019 0900   LABSPEC 1.023 06/06/2014 0554   PHURINE 8.0 01/04/2019 0900   GLUCOSEU NEGATIVE 01/04/2019 0900   GLUCOSEU Negative 06/06/2014 0554   HGBUR SMALL (A) 01/04/2019 0900   BILIRUBINUR NEGATIVE 01/04/2019 0900   BILIRUBINUR Negative 06/06/2014 0554   KETONESUR NEGATIVE 01/04/2019 0900   PROTEINUR NEGATIVE 01/04/2019 0900   NITRITE  POSITIVE (A) 01/04/2019 0900   LEUKOCYTESUR SMALL (A) 01/04/2019 0900   LEUKOCYTESUR Trace 06/06/2014 0554    Lab Results  Component Value Date   BACTERIA RARE (A) 01/04/2019    Pertinent Imaging: CT 2015  No results found for this or any previous visit. No results found for this or any previous visit. No results found for this or any previous visit. No results found for this or any previous visit. No results found for this or any previous visit. No results found for this or any previous visit. No results found for this or any previous visit. No results found for this or any previous visit.  Assessment & Plan:    1. Microscopic hematuria Set up another CT scan and cystoscopy - discussed with patient  - Urinalysis, Complete - Bladder Scan (Post Void Residual) in office  2. Benign prostatic hyperplasia, unspecified whether lower urinary tract symptoms present Discussed addition of tamsulosin, Rx sent.  - Bladder Scan (Post Void Residual) in office  3. 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   No follow-ups on file.  Festus Aloe, MD  [facility]  [address], [place], [place] [address] [phone]

## 2019-02-23 LAB — CULTURE, URINE COMPREHENSIVE

## 2019-02-24 ENCOUNTER — Telehealth: Payer: Self-pay

## 2019-02-24 MED ORDER — SULFAMETHOXAZOLE-TRIMETHOPRIM 800-160 MG PO TABS: 1.0000 | ORAL_TABLET | Freq: Two times a day (BID) | ORAL | 0 refills | Status: DC

## 2019-02-24 NOTE — Telephone Encounter (Signed)
Per Dr. Junious Silk patient has a positive urine culture and needs to start Bactrim DS BID #14. Patient was notified of this and script was sent to pharmacy

## 2019-03-10 ENCOUNTER — Other Ambulatory Visit: Payer: Medicare Other | Admitting: Urology

## 2019-04-02 ENCOUNTER — Ambulatory Visit: Admission: RE | Admit: 2019-04-02 | Payer: Medicare Other | Source: Ambulatory Visit

## 2019-04-07 ENCOUNTER — Other Ambulatory Visit: Payer: Medicare Other | Admitting: Urology

## 2019-04-19 ENCOUNTER — Ambulatory Visit: Admission: RE | Admit: 2019-04-19 | Payer: Medicare Other | Source: Ambulatory Visit

## 2019-05-17 ENCOUNTER — Telehealth: Payer: Self-pay | Admitting: Urology

## 2019-05-17 ENCOUNTER — Other Ambulatory Visit: Payer: Self-pay

## 2019-05-17 ENCOUNTER — Ambulatory Visit
Admission: RE | Admit: 2019-05-17 | Discharge: 2019-05-17 | Disposition: A | Discharge status: Home / Self Care | Payer: Medicare Other | Source: Ambulatory Visit | Attending: Urology | Admitting: Urology

## 2019-05-17 DIAGNOSIS — R3129 Other microscopic hematuria: Secondary | ICD-10-CM | POA: Diagnosis not present

## 2019-05-17 LAB — POCT I-STAT CREATININE: Creatinine, Ser: 1.3 mg/dL — ABNORMAL HIGH (ref 0.61–1.24)

## 2019-05-17 MED ORDER — CIPROFLOXACIN HCL 500 MG PO TABS: 500.0000 mg | ORAL_TABLET | Freq: Two times a day (BID) | ORAL | 0 refills | Status: DC

## 2019-05-17 MED ORDER — IOHEXOL 300 MG/ML  SOLN
150.0000 mL | Freq: Once | INTRAMUSCULAR | Status: AC | PRN
  Administered 2019-05-17: 100 mL via INTRAVENOUS

## 2019-05-17 NOTE — Telephone Encounter (Signed)
Start Cipro 500 mg po BID in preparation for cysto Wednesday.

## 2019-05-17 NOTE — Telephone Encounter (Signed)
-----   Message from Royanne Foots, Cucumber sent at 05/17/2019 10:27 AM EDT -----  ----- Message ----- From: Interface, Rad Results In Sent: 05/17/2019  10:13 AM EDT To: Rowe [facility]

## 2019-05-17 NOTE — Telephone Encounter (Signed)
Left pt mess to call 

## 2019-05-17 NOTE — Telephone Encounter (Signed)
Pt. Called returning a call from the office, asserted that no message was left. Informed the pt it may be with respect to Dr. Lyndal Rainbow message that the pt. Needed to start Cipro. Pt. Asked me to call their caregiver because he did not understand what I was saying. Called caregiver at [phone], if caregiver calls back please let them know that the pt is to start cipro immediately for cysto on 05/19/19

## 2019-05-19 ENCOUNTER — Ambulatory Visit (INDEPENDENT_AMBULATORY_CARE_PROVIDER_SITE_OTHER): Payer: Medicare Other | Admitting: Urology

## 2019-05-19 ENCOUNTER — Telehealth: Payer: Self-pay | Admitting: Family Medicine

## 2019-05-19 ENCOUNTER — Other Ambulatory Visit: Payer: Self-pay

## 2019-05-19 ENCOUNTER — Encounter: Payer: Self-pay | Admitting: Urology

## 2019-05-19 VITALS — BP 107/71 | HR 80 | Ht 72.0 in | Wt 207.5 lb

## 2019-05-19 DIAGNOSIS — N35819 Other urethral stricture, male, unspecified site: Secondary | ICD-10-CM | POA: Diagnosis not present

## 2019-05-19 DIAGNOSIS — R3129 Other microscopic hematuria: Secondary | ICD-10-CM

## 2019-05-19 DIAGNOSIS — R339 Retention of urine, unspecified: Secondary | ICD-10-CM | POA: Diagnosis not present

## 2019-05-19 LAB — URINALYSIS, COMPLETE
Bilirubin, UA: NEGATIVE
Glucose, UA: NEGATIVE
Ketones, UA: NEGATIVE
Nitrite, UA: NEGATIVE
Specific Gravity, UA: 1.02 (ref 1.005–1.030)
Urobilinogen, Ur: 0.2 mg/dL (ref 0.2–1.0)
pH, UA: 8 — ABNORMAL HIGH (ref 5.0–7.5)

## 2019-05-19 LAB — MICROSCOPIC EXAMINATION
RBC, Urine: >30 /hpf — AB (ref 0–2)
WBC, UA: >30 /hpf — AB (ref 0–5)

## 2019-05-19 NOTE — Patient Instructions (Signed)
Urethral Stricture  Urethral stricture is narrowing of the tube (urethra) that carries urine from the bladder out of the body. The urethra can become narrow due to scar tissue from an injury or infection. This can make it difficult to pass urine. In women, the urethra opens above the vaginal opening. In men, the urethra opens at the tip of the penis, and the urethra is much longer than it is in women. Because of the length of the male urethra, urethral stricture is much more common in men. This condition is treated with surgery. What are the causes? Common causes of urethral stricture in men and women include:  Urinary tract infection (UTI).  Sexually transmitted infection (STI).  Use of a tube placed into the urethra to drain urine from the bladder (urinary catheter).  Urinary tract surgery. In men, common causes of urethral stricture include:  A severe injury to the pelvis.  Prostate surgery.  Injury to the penis. In many cases, the cause of urethral stricture may not be known. What increases the risk? Urethral stricture is more likely to develop in:  Men, especially men who have had prostate surgery.  People who use urinary catheters.  People who have had urinary tract surgery. What are the signs or symptoms? The most common symptom of this condition is difficulty passing urine. This may cause decreased urine flow, dribbling, or spraying of urine. Other symptoms may include:  Frequent UTIs.  Blood in the urine.  Pain when urinating.  Swelling of the penis in men.  Inability to pass urine (urinary obstruction). How is this diagnosed? This condition may be diagnosed based on:  Your medical history.  A physical exam.  Urine tests to check for infection or bleeding.  X-rays.  Ultrasound.  Retrograde urethrogram. This is a type of test in which dye is injected into the urethra and then an X-ray is taken.  Urethroscopy. This is when a thin tube with a light and  camera on the end (urethroscope) is used to look at the urethra. How is this treated? This condition is treated with surgery. The type of surgery that you have depends on the severity of your condition. You may have:  Urethral dilation. In this procedure, the narrow part of the urethra is stretched open (dilated) with dilating instruments or a small balloon.  Urethrotomy. In this procedure, a urethroscope is placed into the urethra, and the narrow part of the urethra is cut open with a surgical blade inserted through the urethroscope.  Open surgery. In this procedure, an incision is made in the urethra, the narrow part is removed, and the urethra is reconstructed. Follow these instructions at home:   Take over-the-counter and prescription medicines only as told by your health care provider.  If you were prescribed an antibiotic medicine, take it as told by your health care provider. Do not stop taking the antibiotic even if you start to feel better.  Drink enough fluid to keep your urine clear or pale yellow.  Keep all follow-up visits as told by your health care provider. This is important. Contact a health care provider if:  You have signs of a urinary tract infection, such as: ? Frequent urination or passing small amounts of urine frequently. ? Needing to urinate urgently. ? Pain or burning with urination. ? Urine that smells bad or unusual. ? Cloudy urine. ? Pain in the lower abdomen or back. ? Trouble urinating. ? Blood in the urine. ? Vomiting or being less hungry than  normal. ? Diarrhea or abdominal pain. ? Vaginal discharge, if you are male.  Your symptoms are getting worse instead of better. Get help right away if:  You cannot pass urine.  You have a fever.  You have swelling, bruising, or discoloration of your genital area. This includes the penis, scrotum, and inner thighs for men, and the outer genital organs (vulva) and inner thighs for women.  You develop  swelling in your legs.  You have difficulty breathing. This information is not intended to replace advice given to you by your health care provider. Make sure you discuss any questions you have with your health care provider. Document Released: 12/22/2015 Document Revised: 05/02/2016 Document Reviewed: 11/12/2015 Elsevier Interactive Patient Education  2019 [employer].

## 2019-05-19 NOTE — Telephone Encounter (Signed)
Left message for Caregiver Darlene at [phone] to call office back. She needs to be informed patient is getting a referral to go to [facility] for a stricture.

## 2019-05-19 NOTE — Progress Notes (Signed)
05/19/2019 10:10 AM   Jesus Hawkins [birth year] [id number]  Referring provider: Tracie Harrier, MD [address] [facility] [place],  [address]  Chief Complaint  Patient presents with  . Cysto    HPI:  Jesus Hawkins is a 53 yo male who follows up for a couple of issues --   1) microscopic hematuria -  Urinalysis January 11, 2019 revealed 10-50 red blood cells per high-powered field.  He's had no gross hematuria. He is a smoker. Urine cx mixed growth Jul 2019.   2) urethral sx and incomplete bladder emptying -  CT scan from 2015 revealed a distended bladder with an irregular appearing wall such as cellules or trabeculation. Prostate mild BPH. He underwent DVIU with Dr. Yves Dill for a distal bulbar urethra pinpoint opening.   His February 2020 BUN was 17, creatinine 1.09 with a GFR greater than 60. PVR 02/17/2019 was 671 ml. He voids sometimes frequently. He strains sometimes to void and has a weak stream, but reports a good flow at times.  Repeat CT scan was done 05/17/2019 which showed benign urinary tract with a distended trabeculated bladder.  There was no hydronephrosis.  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 As a result his right side is weaker. He would have some difficulty doing CIC.   He returns for cystoscopy and to discuss CT findings, management of urethral stricture and incomplete bladder emptying.    PMH: Past Medical History:  Diagnosis Date  . Head injury   . Hyperlipidemia   . Hypertension     Surgical History: Past Surgical History:  Procedure Laterality Date  . COLONOSCOPY WITH PROPOFOL N/A 10/13/2015   Procedure: COLONOSCOPY WITH PROPOFOL;  Surgeon: Lollie Sails, MD;  Location: [facility] ENDOSCOPY;  Service: Endoscopy;  Laterality: N/A;  . SHOULDER SURGERY Right   . TOE FUSION      Home Medications:  Allergies as of 05/19/2019   No Known  Allergies     Medication List       Accurate as of May 19, 2019 10:10 AM. If you have any questions, ask your nurse or doctor.        STOP taking these medications   sulfamethoxazole-trimethoprim 800-160 MG tablet Commonly known as:  BACTRIM DS Stopped by:  Festus Aloe, MD     TAKE these medications   amLODipine 5 MG tablet Commonly known as:  NORVASC Take 5 mg by mouth daily.   atorvastatin 10 MG tablet Commonly known as:  LIPITOR Take 10 mg by mouth daily.   butalbital-acetaminophen-caffeine 50-325-40 MG tablet Commonly known as:  FIORICET Take 1 tablet by mouth every 6 (six) hours as needed for headache.   ciprofloxacin 500 MG tablet Commonly known as:  CIPRO Take 1 tablet (500 mg total) by mouth 2 (two) times daily for 5 days.   meloxicam 15 MG tablet Commonly known as:  MOBIC Take 15 mg by mouth daily.   niacin 500 MG tablet Take 500 mg by mouth daily.   OMEGA-3 FATTY ACIDS-VITAMIN E PO Take 1,000 mg by mouth 2 (two) times daily.   tamsulosin 0.4 MG Caps capsule Commonly known as:  FLOMAX Take 1 capsule (0.4 mg total) by mouth daily after supper.       Allergies: No Known Allergies  Family History: History reviewed. No pertinent family history.  Social History:  reports that he has been smoking. He has a 10.00 pack-year smoking history. He  has never used smokeless tobacco. He reports current alcohol use of about 3.0 standard drinks of alcohol per week. He reports that he does not use drugs.  ROS:                                          05/19/19  CC:  Chief Complaint  Patient presents with  . Cysto    HPI:  Blood pressure 107/71, pulse 80, height 6' (1.829 m), weight 94.1 kg. NED. A&Ox3.   No respiratory distress   Abd soft, NT, ND Normal phallus with bilateral descended testicles  Cystoscopy Procedure Note  Patient identification was confirmed, informed consent was obtained, and patient was prepped  using Betadine solution.  Lidocaine jelly was administered per urethral meatus.     Pre-Procedure: - Inspection reveals a normal caliber ureteral meatus.  Procedure: The flexible cystoscope was introduced without difficulty - there was a narrow, dense bulb stricture 1-2 cm -- given moderate bacteria in the urine I did not manipulate. He was given a Cipro.    Post-Procedure: - Patient tolerated the procedure well   Laboratory Data: Lab Results  Component Value Date   WBC 7.9 01/19/2019   HGB 13.4 01/19/2019   HCT 40.7 01/19/2019   MCV 77.5 (L) 01/19/2019   PLT 182 01/19/2019    Lab Results  Component Value Date   CREATININE 1.30 (H) 05/17/2019    No results found for: PSA  No results found for: TESTOSTERONE  No results found for: HGBA1C  Urinalysis    Component Value Date/Time   COLORURINE YELLOW (A) 01/04/2019 0900   APPEARANCEUR Cloudy (A) 02/17/2019 0834   LABSPEC 1.011 01/04/2019 0900   LABSPEC 1.023 06/06/2014 0554   PHURINE 8.0 01/04/2019 0900   GLUCOSEU Negative 02/17/2019 0834   GLUCOSEU Negative 06/06/2014 0554   HGBUR SMALL (A) 01/04/2019 0900   BILIRUBINUR Negative 02/17/2019 0834   BILIRUBINUR Negative 06/06/2014 0554   KETONESUR NEGATIVE 01/04/2019 0900   PROTEINUR 1+ (A) 02/17/2019 0834   PROTEINUR NEGATIVE 01/04/2019 0900   NITRITE Positive (A) 02/17/2019 0834   NITRITE POSITIVE (A) 01/04/2019 0900   LEUKOCYTESUR 3+ (A) 02/17/2019 0834   LEUKOCYTESUR Trace 06/06/2014 0554    Lab Results  Component Value Date   WBCUA >30W 02/17/2019   RBCUA 3-10 (A) 02/17/2019   LABEPIT 0-10 02/17/2019   BACTERIA Many (A) 02/17/2019    Pertinent Imaging: CT No results found for this or any previous visit. No results found for this or any previous visit. No results found for this or any previous visit. No results found for this or any previous visit. No results found for this or any previous visit. No results found for this or any previous visit.  No results found for this or any previous visit. No results found for this or any previous visit.  Assessment & Plan:    1. Microscopic hematuria Benign eval, although he will need completion of cystoscopy - Urinalysis, Complete  2. Urethral stricture  -I went over with Jesus Hawkins the nature risk and benefits of surveillance, repeat dilation or urethroplasty.  I think to simplify things for him I am going to refer him to see Dr. Francesca Jewett can consider repeat dilation or urethroplasty. He is not a good candidate for CIC given his cognitive and functional limitations.    No follow-ups on file.  Festus Aloe, MD  [facility]  [address], [place], [place] [address] [phone]

## 2019-05-19 NOTE — Telephone Encounter (Signed)
Patient's caregiver notified and voiced understand. She is expecting a call to set up appointment

## 2019-08-04 IMAGING — CT CT HEAD W/O CM
3 series · 16 of 47 positions shown, 19 images · non-contrast
Comparison: 03/01/2011

CLINICAL DATA: Dizziness, weakness, and headaches since last night.

EXAM:
CT HEAD WITHOUT CONTRAST
TECHNIQUE: Contiguous axial images were obtained from the base of the skull
through the vertex without intravenous contrast.

[Series 2: head wo · axial · 0.47mm/px · z∈[-141,-16]mm · 10 of 30 slices shown, 13 images]
[im 3/30  brain]
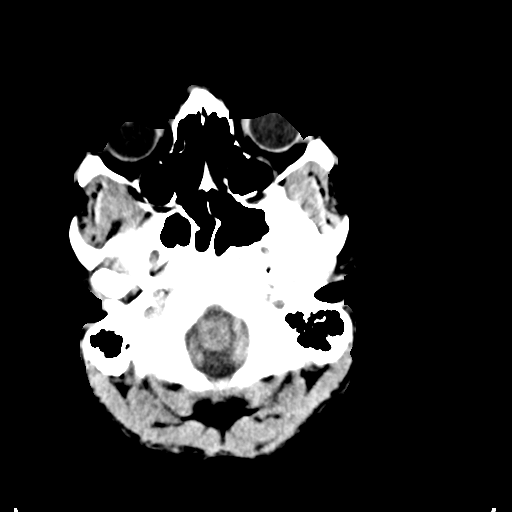
[im 3/30  bone]
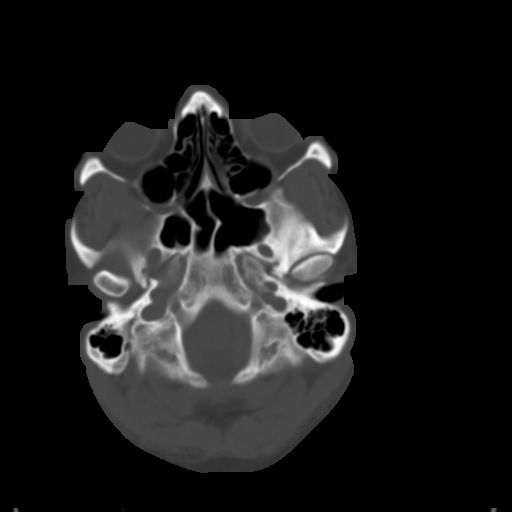
[im 6/30  brain]
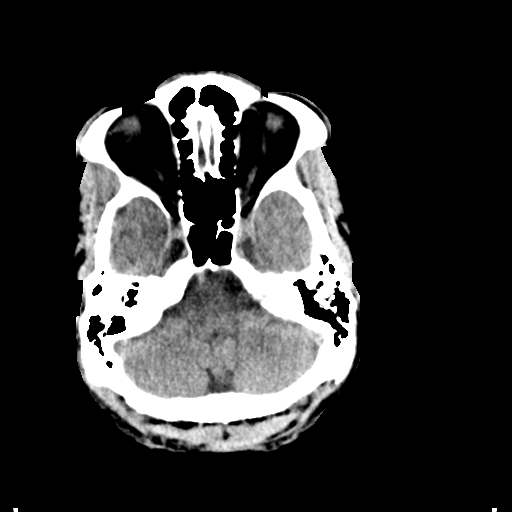
[im 9/30  brain]
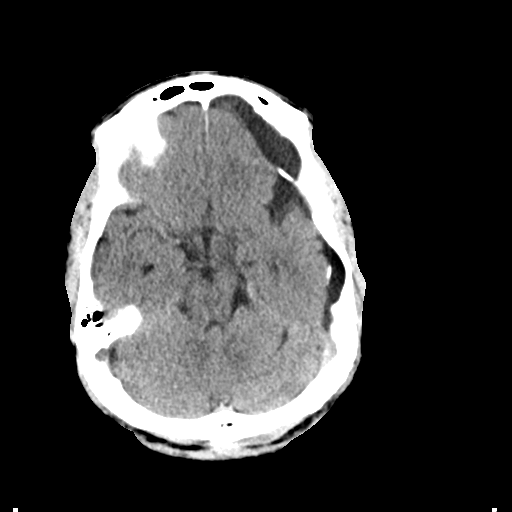
[im 11/30  brain]
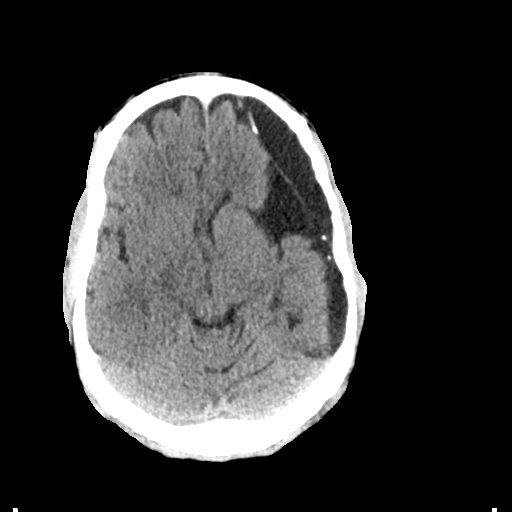
[im 14/30  brain]
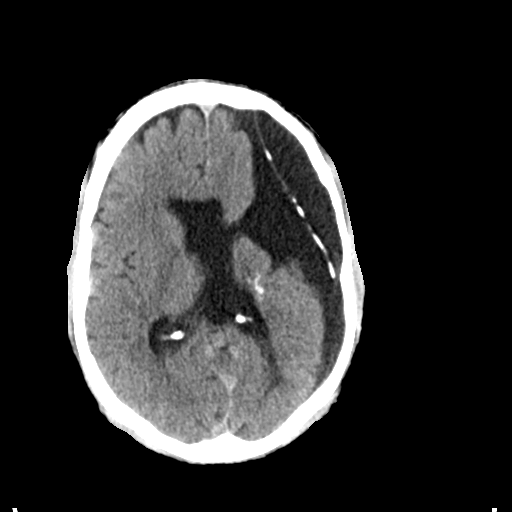
[im 14/30  bone]
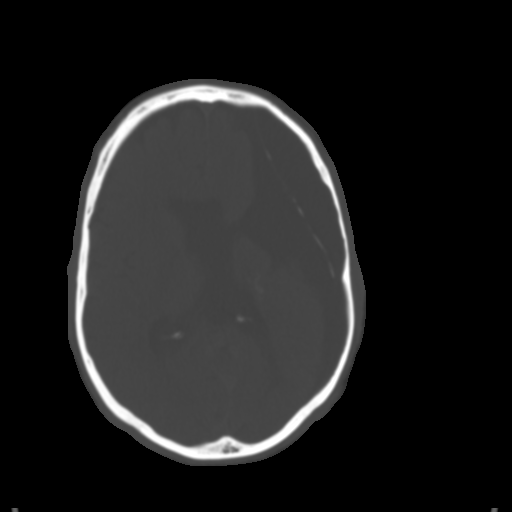
[im 17/30  brain]
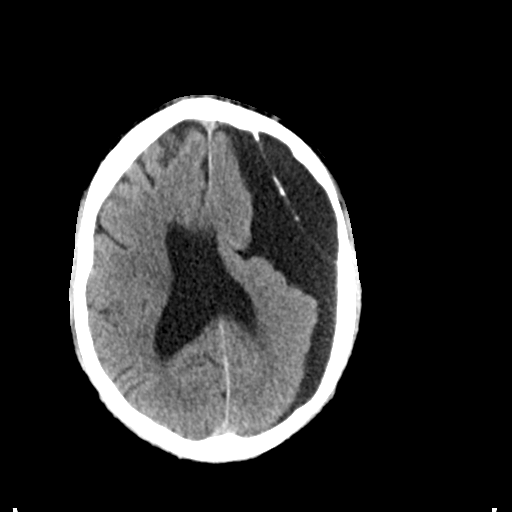
[im 20/30  brain]
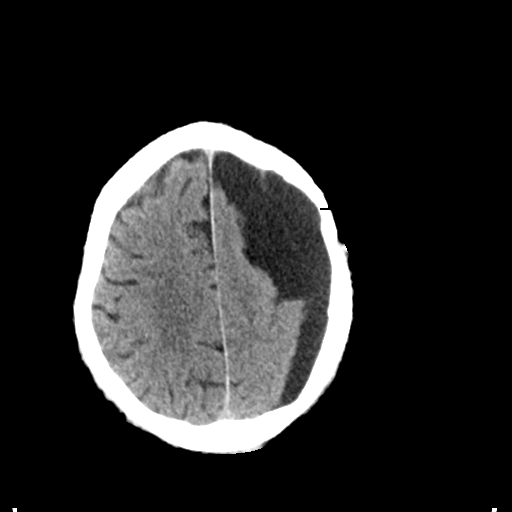
[im 23/30  brain]
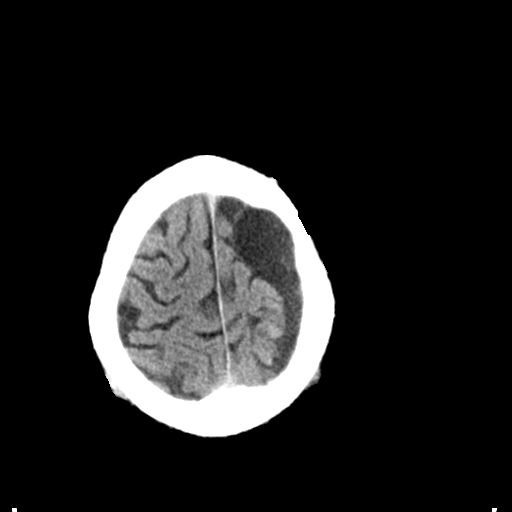
[im 25/30  brain]
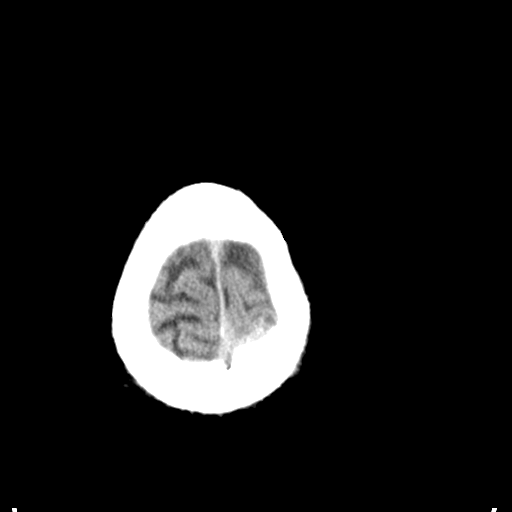
[im 25/30  bone]
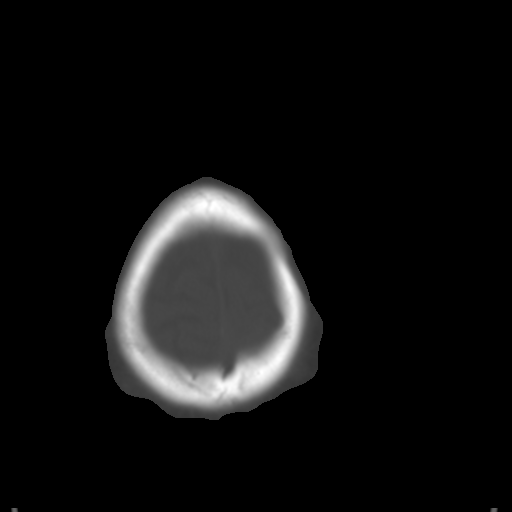
[im 28/30  brain]
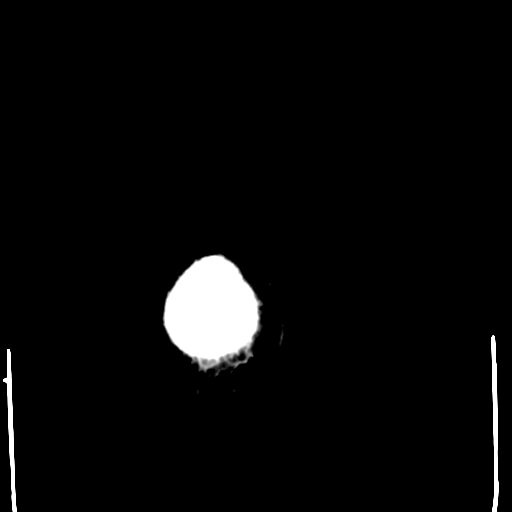

[Series 4: coronal soft tissue · coronal · 0.30mm/px · 3 of 66 slices shown]
[im 22/66  brain]
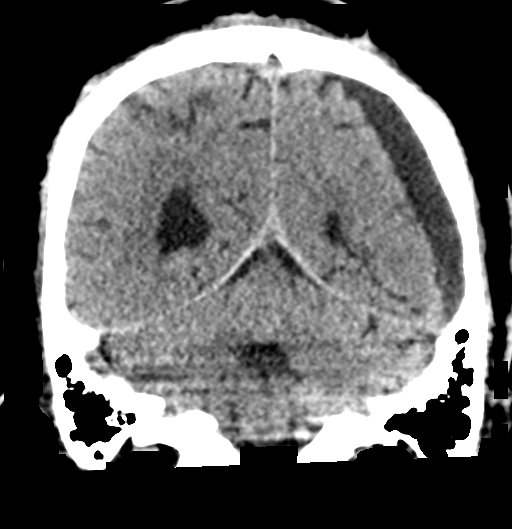
[im 29/66  brain]
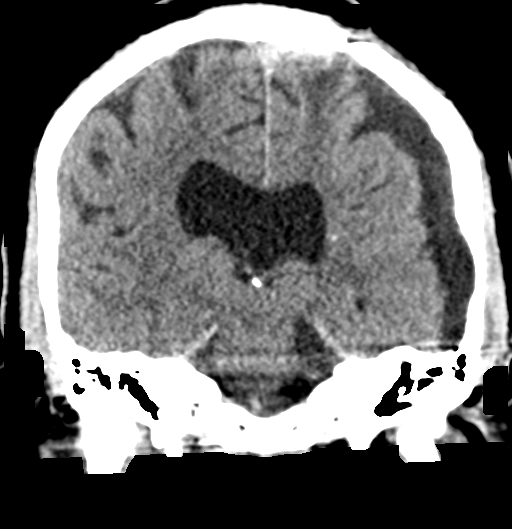
[im 37/66  brain]
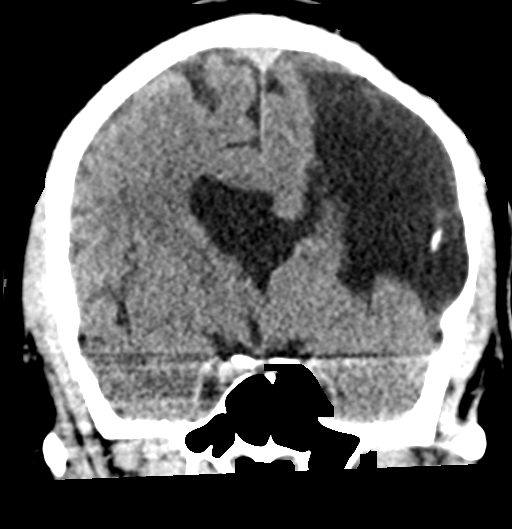

[Series 5: sagittal soft tissue · sagittal · 0.31mm/px · 3 of 51 slices shown]
[im 17/51  brain]
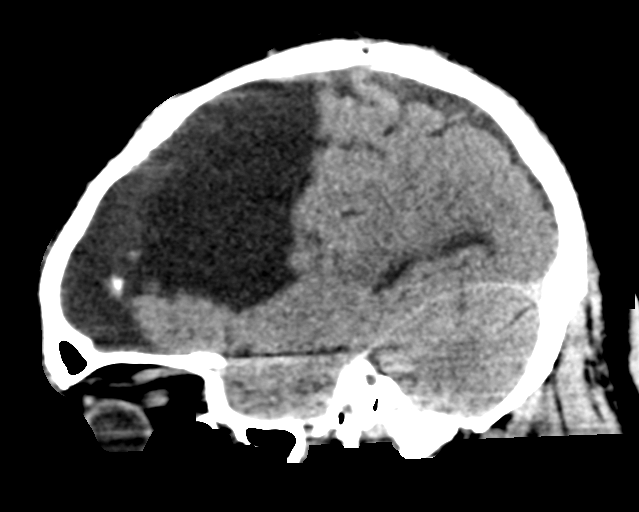
[im 26/51  brain]
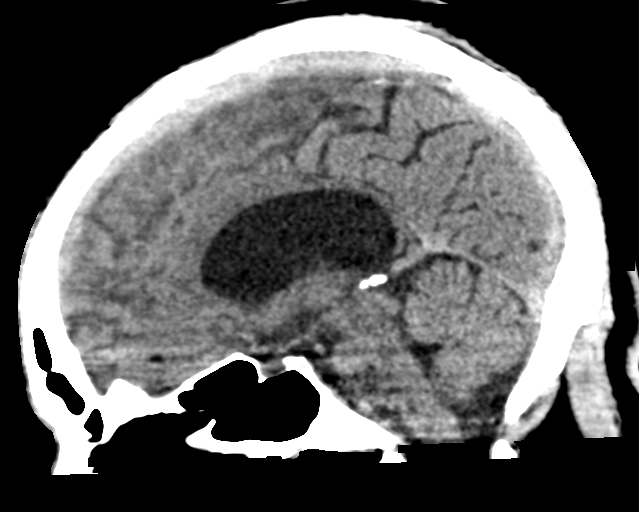
[im 34/51  brain]
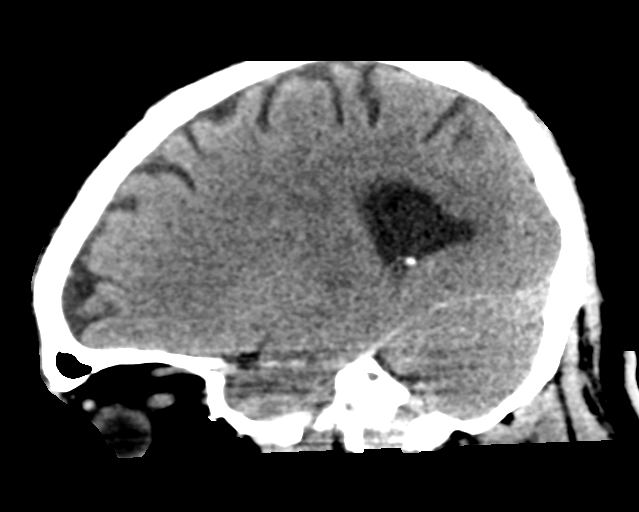

[16 of 47 positions shown; findings below may reference images not displayed]

FINDINGS: Brain: A cleft is again seen laterally in the left frontal lobe
which communicates with the left lateral ventricle. Mildly nodular
appearance of brain parenchyma just posterior to the cleft with
associated calcification is unchanged. Chronic low-density
extra-axial fluid collection over the left cerebral convexity with a
partially calcified membrane is unchanged as is 10 mm of rightward
midline shift. The ventricles are unchanged in size and morphology
with suspected partial absence of the septum pellucidum. No acute
infarct or acute intracranial hemorrhage is identified.

Vascular: No hyperdense vessel.

Skull: No fracture or focal osseous lesion.

Sinuses/Orbits: Visualized paranasal sinuses and mastoid air cells
are clear. Visualized orbits are unremarkable.

Other: None.
IMPRESSION: 1. No evidence of acute intracranial abnormality.
2. Stable chronic changes including left frontal schizencephaly and
chronic left convexity extra-axial fluid collection.

## 2020-03-16 ENCOUNTER — Other Ambulatory Visit: Payer: Self-pay

## 2020-03-16 DIAGNOSIS — N401 Enlarged prostate with lower urinary tract symptoms: Secondary | ICD-10-CM

## 2020-03-16 DIAGNOSIS — R3914 Feeling of incomplete bladder emptying: Secondary | ICD-10-CM

## 2020-03-16 MED ORDER — TAMSULOSIN HCL 0.4 MG PO CAPS: 0.4000 mg | ORAL_CAPSULE | Freq: Every day | ORAL | 11 refills | Status: AC

## 2022-10-29 ENCOUNTER — Emergency Department: Payer: 59

## 2022-10-29 ENCOUNTER — Emergency Department
Admission: EM | Admit: 2022-10-29 | Discharge: 2022-10-29 | Disposition: A | Discharge status: Home / Self Care | Payer: 59 | Attending: Emergency Medicine | Admitting: Emergency Medicine

## 2022-10-29 ENCOUNTER — Encounter: Payer: Self-pay | Admitting: Emergency Medicine

## 2022-10-29 DIAGNOSIS — R1032 Left lower quadrant pain: Secondary | ICD-10-CM | POA: Diagnosis not present

## 2022-10-29 DIAGNOSIS — R42 Dizziness and giddiness: Secondary | ICD-10-CM | POA: Diagnosis not present

## 2022-10-29 DIAGNOSIS — M5137 Other intervertebral disc degeneration, lumbosacral region: Secondary | ICD-10-CM | POA: Diagnosis not present

## 2022-10-29 DIAGNOSIS — R509 Fever, unspecified: Secondary | ICD-10-CM | POA: Diagnosis not present

## 2022-10-29 DIAGNOSIS — Z1152 Encounter for screening for COVID-19: Secondary | ICD-10-CM | POA: Diagnosis not present

## 2022-10-29 DIAGNOSIS — R112 Nausea with vomiting, unspecified: Secondary | ICD-10-CM | POA: Diagnosis present

## 2022-10-29 LAB — URINALYSIS, ROUTINE W REFLEX MICROSCOPIC
Bilirubin Urine: NEGATIVE
Glucose, UA: NEGATIVE mg/dL
Hgb urine dipstick: NEGATIVE
Ketones, ur: NEGATIVE mg/dL
Nitrite: POSITIVE — AB
Protein, ur: NEGATIVE mg/dL
Specific Gravity, Urine: 1.012 (ref 1.005–1.030)
Squamous Epithelial / HPF: NONE SEEN (ref 0–5)
pH: 8 (ref 5.0–8.0)

## 2022-10-29 LAB — COMPREHENSIVE METABOLIC PANEL
ALT: 33 U/L (ref 0–44)
AST: 24 U/L (ref 15–41)
Albumin: 3.8 g/dL (ref 3.5–5.0)
Alkaline Phosphatase: 84 U/L (ref 38–126)
Anion gap: 8 (ref 5–15)
BUN: 12 mg/dL (ref 6–20)
CO2: 22 mmol/L (ref 22–32)
Calcium: 8.6 mg/dL — ABNORMAL LOW (ref 8.9–10.3)
Chloride: 107 mmol/L (ref 98–111)
Creatinine, Ser: 1.01 mg/dL (ref 0.61–1.24)
GFR, Estimated: >60 mL/min (ref >60)
Glucose, Bld: 153 mg/dL — ABNORMAL HIGH (ref 70–99)
Potassium: 4 mmol/L (ref 3.5–5.1)
Sodium: 137 mmol/L (ref 135–145)
Total Bilirubin: 0.7 mg/dL (ref 0.3–1.2)
Total Protein: 7 g/dL (ref 6.5–8.1)

## 2022-10-29 LAB — URINE DRUG SCREEN, QUALITATIVE (ARMC ONLY)
Amphetamines, Ur Screen: NOT DETECTED
Barbiturates, Ur Screen: NOT DETECTED
Benzodiazepine, Ur Scrn: NOT DETECTED
Cannabinoid 50 Ng, Ur ~~LOC~~: NOT DETECTED
Cocaine Metabolite,Ur ~~LOC~~: NOT DETECTED
MDMA (Ecstasy)Ur Screen: NOT DETECTED
Methadone Scn, Ur: NOT DETECTED
Opiate, Ur Screen: NOT DETECTED
Phencyclidine (PCP) Ur S: NOT DETECTED
Tricyclic, Ur Screen: NOT DETECTED

## 2022-10-29 LAB — LIPASE, BLOOD: Lipase: 24 U/L (ref 11–51)

## 2022-10-29 LAB — CBC WITH DIFFERENTIAL/PLATELET
Abs Immature Granulocytes: 0.02 10*3/uL (ref 0.00–0.07)
Basophils Absolute: 0 10*3/uL (ref 0.0–0.1)
Basophils Relative: 0 %
Eosinophils Absolute: 0.1 10*3/uL (ref 0.0–0.5)
Eosinophils Relative: 1 %
HCT: 43.1 % (ref 39.0–52.0)
Hemoglobin: 14.2 g/dL (ref 13.0–17.0)
Immature Granulocytes: 0 %
Lymphocytes Relative: 16 %
Lymphs Abs: 1.2 10*3/uL (ref 0.7–4.0)
MCH: 25.3 pg — ABNORMAL LOW (ref 26.0–34.0)
MCHC: 32.9 g/dL (ref 30.0–36.0)
MCV: 76.8 fL — ABNORMAL LOW (ref 80.0–100.0)
Monocytes Absolute: 0.6 10*3/uL (ref 0.1–1.0)
Monocytes Relative: 9 %
Neutro Abs: 5.4 10*3/uL (ref 1.7–7.7)
Neutrophils Relative %: 74 %
Platelets: 163 10*3/uL (ref 150–400)
RBC: 5.61 MIL/uL (ref 4.22–5.81)
RDW: 15.2 % (ref 11.5–15.5)
WBC: 7.3 10*3/uL (ref 4.0–10.5)
nRBC: 0 % (ref 0.0–0.2)

## 2022-10-29 LAB — RESP PANEL BY RT-PCR (FLU A&B, COVID) ARPGX2
Influenza A by PCR: NEGATIVE
Influenza B by PCR: NEGATIVE
SARS Coronavirus 2 by RT PCR: NEGATIVE

## 2022-10-29 LAB — ACETAMINOPHEN LEVEL: Acetaminophen (Tylenol), Serum: <10 ug/mL — ABNORMAL LOW (ref 10–30)

## 2022-10-29 LAB — SALICYLATE LEVEL: Salicylate Lvl: <7 mg/dL — ABNORMAL LOW (ref 7.0–30.0)

## 2022-10-29 LAB — ETHANOL: Alcohol, Ethyl (B): <10 mg/dL (ref <10)

## 2022-10-29 MED ORDER — CEPHALEXIN 500 MG PO CAPS
500.0000 mg | ORAL_CAPSULE | Freq: Once | ORAL | Status: AC
  Administered 2022-10-29: 500 mg via ORAL
  Filled 2022-10-29: qty 1

## 2022-10-29 MED ORDER — CEPHALEXIN 500 MG PO CAPS: 500.0000 mg | ORAL_CAPSULE | Freq: Three times a day (TID) | ORAL | 0 refills | Status: AC

## 2022-10-29 MED ORDER — ONDANSETRON 4 MG PO TBDP: 4.0000 mg | ORAL_TABLET | Freq: Three times a day (TID) | ORAL | 0 refills | Status: AC | PRN

## 2022-10-29 MED ORDER — SODIUM CHLORIDE 0.9 % IV BOLUS
1000.0000 mL | Freq: Once | INTRAVENOUS | Status: AC
  Administered 2022-10-29: 1000 mL via INTRAVENOUS

## 2022-10-29 MED ORDER — ACETAMINOPHEN 325 MG PO TABS
650.0000 mg | ORAL_TABLET | Freq: Once | ORAL | Status: AC
  Administered 2022-10-29: 650 mg via ORAL
  Filled 2022-10-29: qty 2

## 2022-10-29 MED ORDER — IOHEXOL 350 MG/ML SOLN
75.0000 mL | Freq: Once | INTRAVENOUS | Status: AC | PRN
  Administered 2022-10-29: 75 mL via INTRAVENOUS

## 2022-10-29 NOTE — ED Provider Notes (Signed)
[facility] Provider Note   Event Date/Time   First MD Initiated Contact with Patient 10/29/22 1236     (approximate) History  Emesis  HPI Jesus Hawkins is a 56 y.o. male with a past medical history of schizencephaly who presents via EMS for vertigo, subjective fever, abdominal pain, and nausea/vomiting that began this morning at approximately 8 AM.  EMS states that patient was minimally responsive and not wanting to speak or cooperate with exam.  They also note that patient "threw himself on the floor" in triage. ROS: Patient currently denies any tinnitus, difficulty speaking, facial droop, sore throat, chest pain, shortness of breath, diarrhea, dysuria, or weakness/numbness/paresthesias in any extremity   Physical Exam  Triage Vital Signs: ED Triage Vitals  Enc Vitals Group     BP      Pulse      Resp      Temp      Temp src      SpO2      Weight      Height      Head Circumference      Peak Flow      Pain Score      Pain Loc      Pain Edu?      Excl. in [place]?    Most recent vital signs: Vitals:   10/29/22 1236 10/29/22 1246  BP: 119/76   Pulse: 60   Resp:  16  Temp: 97.9 F (36.6 C)   SpO2: 97%    General: Awake, oriented x4. CV:  Good peripheral perfusion.  Resp:  Normal effort.  Abd:  No distention.  Other:  Middle-aged African-American male laying in bed in mild distress.  Patient is slow to answer questions and enunciate but is appropriate with effort.  Moves all extremities spontaneously.  Unable to perform hints exam due to patient acuity ED Results / Procedures / Treatments  Labs (all labs ordered are listed, but only abnormal results are displayed) Labs Reviewed  COMPREHENSIVE METABOLIC PANEL - Abnormal; Notable for the following components:      Result Value   Glucose, Bld 153 (*)    Calcium 8.6 (*)    All other components within normal limits  CBC WITH DIFFERENTIAL/PLATELET - Abnormal; Notable for the following components:    MCV 76.8 (*)    MCH 25.3 (*)    All other components within normal limits  SALICYLATE LEVEL - Abnormal; Notable for the following components:   Salicylate Lvl <2.4 (*)    All other components within normal limits  ACETAMINOPHEN LEVEL - Abnormal; Notable for the following components:   Acetaminophen (Tylenol), Serum <10 (*)    All other components within normal limits  RESP PANEL BY RT-PCR (FLU A&B, COVID) ARPGX2  LIPASE, BLOOD  ETHANOL  URINE DRUG SCREEN, QUALITATIVE ([facility] ONLY)  URINALYSIS, ROUTINE W REFLEX MICROSCOPIC   EKG ED ECG REPORT I, Naaman Plummer, the attending physician, personally viewed and interpreted this ECG. Date: 10/29/2022 EKG Time: 1253 Rate: 61 Rhythm: normal sinus rhythm QRS Axis: normal Intervals: normal ST/T Wave abnormalities: normal Narrative Interpretation: no evidence of acute ischemia RADIOLOGY ED MD interpretation: CT of the head without contrast shows no evidence of acute intracranial pathology and shows unchanged left hemispheric schizencephaly with large chronic overlying subdural collection.  This imaging was interpreted by me -Agree with radiology assessment Official radiology report(s): CT Head Wo Contrast  Result Date: 10/29/2022 CLINICAL DATA:  Headache, nausea, vomiting EXAM: CT  HEAD WITHOUT CONTRAST TECHNIQUE: Contiguous axial images were obtained from the base of the skull through the vertex without intravenous contrast. RADIATION DOSE REDUCTION: This exam was performed according to the departmental dose-optimization program which includes automated exposure control, adjustment of the mA and/or kV according to patient size and/or use of iterative reconstruction technique. COMPARISON:  01/04/2019 FINDINGS: Brain: No evidence of acute infarction, hemorrhage, hydrocephalus, acute extra-axial collection or mass lesion/mass effect. Unchanged left hemispheric schizencephaly with a large, chronic overlying subdural collection (series 2, image 15).  Vascular: No hyperdense vessel or unexpected calcification. Skull: Normal. Negative for fracture or focal lesion. Sinuses/Orbits: No acute finding. Other: None. IMPRESSION: 1. No acute intracranial pathology. 2. Unchanged left hemispheric schizencephaly with a large, chronic overlying subdural collection. Electronically Signed   By: Delanna Ahmadi M.D.   On: 10/29/2022 13:28   PROCEDURES: Critical Care performed: No Procedures MEDICATIONS ORDERED IN ED: Medications  sodium chloride 0.9 % bolus 1,000 mL (1,000 mLs Intravenous New Bag/Given 10/29/22 1405)   IMPRESSION / MDM / ASSESSMENT AND PLAN / ED COURSE  I reviewed the triage vital signs and the nursing notes.                             The patient is on the cardiac monitor to evaluate for evidence of arrhythmia and/or significant heart rate changes. Patient's presentation is most consistent with acute presentation with potential threat to life or bodily function. Patient a 56 year old male who presents complaining of vertigo, vomiting, and abdominal pain that began approximately 8 AM today.  Patient's noncontrast head CT does not show any evidence of acute abnormalities.  Patient is no longer vomiting after Zofran administration and patient will receive 1 L NS IVF replacement given his vomiting earlier.  Patient is pending a CT angiography of the head and neck as I cannot perform an adequate hints exam to determine whether patient's vertigo is peripheral versus central.  Care of this patient will be signed out to the oncoming physician at the end of my shift.  All pertinent patient information conveyed and all questions answered.  All further care and disposition decisions will be made by the oncoming physician.   FINAL CLINICAL IMPRESSION(S) / ED DIAGNOSES   Final diagnoses:  Nausea and vomiting, unspecified vomiting type  Left lower quadrant abdominal pain  Dizziness   Rx / DC Orders   ED Discharge Orders     None      Note:  This  document was prepared using Dragon voice recognition software and may include unintentional dictation errors.   Naaman Plummer, MD 10/29/22 [phone]

## 2022-10-29 NOTE — ED Provider Notes (Signed)
Patient received in signout from Dr. Neldon Labella pending follow-up CT imaging head neck abdomen pelvis patient presented with reported lethargy nausea vomiting abdominal pain.  Does not have any focal neurodeficits on exam at this point.  Has some chronic right-sided weakness.  He states he has a mild headache but is feeling improved.  Denies any substance abuse.  States he was able to empty his bladder after the CT scanner therefore I do not believe that he is retention.  Has had some increased urinary frequency discomfort.  No flank pain.  CT imaging does not show any evidence of acute intracranial abnormality.  Does show some findings concerning for possible cystitis urinalysis being nitrite positive with many bacteria.  Will cover with antibiotics.  He is not septic.   Given his otherwise reassuring exam I do not feel the patient requires hospitalization and will be appropriate for outpatient management.   Merlyn Lot, MD 10/29/22 [phone]

## 2022-10-29 NOTE — ED Notes (Signed)
Pt asks for blankets, blankets given.

## 2022-10-29 NOTE — ED Triage Notes (Signed)
Pt brought in by ems from home with Nausea/vomiting. EMS reports 3-4 episodes of white chalky emesis. Pt lethargic on arrival to room 47

## 2022-10-30 LAB — CHLAMYDIA/NGC RT PCR (ARMC ONLY)
Chlamydia Tr: NOT DETECTED
N gonorrhoeae: NOT DETECTED

## 2022-11-01 LAB — URINE CULTURE: Culture: >=100000 — AB

## 2022-11-02 NOTE — Progress Notes (Signed)
ED Antimicrobial Stewardship Positive Culture Follow Up   Jesus Hawkins is an 56 y.o. male who presented to [facility] on 10/29/2022 with a chief complaint of  Chief Complaint  Patient presents with   Emesis    Recent Results (from the past 720 hour(s))  Resp Panel by RT-PCR (Flu A&B, Covid) Anterior Nasal Swab     Status: None   Collection Time: 10/29/22 12:53 PM   Specimen: Anterior Nasal Swab  Result Value Ref Range Status   SARS Coronavirus 2 by RT PCR NEGATIVE NEGATIVE Final    Comment: (NOTE) SARS-CoV-2 target nucleic acids are NOT DETECTED.  The SARS-CoV-2 RNA is generally detectable in upper respiratory specimens during the acute phase of infection. The lowest concentration of SARS-CoV-2 viral copies this assay can detect is 138 copies/mL. A negative result does not preclude SARS-Cov-2 infection and should not be used as the sole basis for treatment or other patient management decisions. A negative result may occur with  improper specimen collection/handling, submission of specimen other than nasopharyngeal swab, presence of viral mutation(s) within the areas targeted by this assay, and inadequate number of viral copies(<138 copies/mL). A negative result must be combined with clinical observations, patient history, and epidemiological information. The expected result is Negative.  Fact Sheet for Patients:  EntrepreneurPulse.com.au  Fact Sheet for Healthcare Providers:  IncredibleEmployment.be  This test is no t yet approved or cleared by the [place] FDA and  has been authorized for detection and/or diagnosis of SARS-CoV-2 by FDA under an Emergency Use Authorization (EUA). This EUA will remain  in effect (meaning this test can be used) for the duration of the COVID-19 declaration under Section 564(b)(1) of the Act, 21 U.S.C.section 360bbb-3(b)(1), unless the authorization is terminated  or revoked sooner.       Influenza A  by PCR NEGATIVE NEGATIVE Final   Influenza B by PCR NEGATIVE NEGATIVE Final    Comment: (NOTE) The Xpert Xpress SARS-CoV-2/FLU/RSV plus assay is intended as an aid in the diagnosis of influenza from Nasopharyngeal swab specimens and should not be used as a sole basis for treatment. Nasal washings and aspirates are unacceptable for Xpert Xpress SARS-CoV-2/FLU/RSV testing.  Fact Sheet for Patients: EntrepreneurPulse.com.au  Fact Sheet for Healthcare Providers: IncredibleEmployment.be  This test is not yet approved or cleared by the [place] FDA and has been authorized for detection and/or diagnosis of SARS-CoV-2 by FDA under an Emergency Use Authorization (EUA). This EUA will remain in effect (meaning this test can be used) for the duration of the COVID-19 declaration under Section 564(b)(1) of the Act, 21 U.S.C. section 360bbb-3(b)(1), unless the authorization is terminated or revoked.  Performed at [facility], [address], [place], [place] [address]   Urine Culture     Status: Abnormal   Collection Time: 10/29/22  3:17 PM   Specimen: Urine, Clean Catch  Result Value Ref Range Status   Specimen Description   Final    URINE, CLEAN CATCH Performed at [facility], [address], [place], [place] [address]    Special Requests   Final    NONE Performed at [facility], [address] [place], [place] [address]    Culture >=100,000 COLONIES/mL [facility] MORGANII (A)  Final   Report Status 11/01/2022 FINAL  Final   Organism ID, Bacteria MORGANELLA MORGANII (A)  Final      Susceptibility   Morganella morganii - MIC*    AMPICILLIN >=32 RESISTANT Resistant     CEFAZOLIN >=  64 RESISTANT Resistant     CIPROFLOXACIN >=4 RESISTANT Resistant     GENTAMICIN <=1 SENSITIVE Sensitive     IMIPENEM 1 SENSITIVE Sensitive     NITROFURANTOIN RESISTANT Resistant     TRIMETH/SULFA >=320 RESISTANT Resistant      AMPICILLIN/SULBACTAM 16 INTERMEDIATE Intermediate     PIP/TAZO <=4 SENSITIVE Sensitive     * >=100,000 COLONIES/mL MORGANELLA MORGANII  Chlamydia/NGC rt PCR ([place] only)     Status: None   Collection Time: 10/29/22  3:17 PM   Specimen: Urine, Clean Catch  Result Value Ref Range Status   Specimen source GC/Chlam URINE, RANDOM  Final   Chlamydia Tr NOT DETECTED NOT DETECTED Final   N gonorrhoeae NOT DETECTED NOT DETECTED Final    Comment: (NOTE) This CT/NG assay has not been evaluated in patients with a history of  hysterectomy. Performed at [facility], [address], [place], [place] [address]     '[x]'$  Treated with cephalexin, organism resistant to prescribed antimicrobial '[]'$  Patient discharged originally without antimicrobial agent and treatment is now indicated  New antibiotic prescription: None Called to speak with patient to see if patient improving. Patient stated he feels 100% better with the zofran. Informed him of his urine culture results and his current antibiotic not covering the organism. Patient agreed to stop current antibiotic and return to ED with any worsening symptoms  ED Provider: Dr. Quentin Cornwall, Verdene Rio, PharmD, BCPS Clinical Pharmacist   11/02/2022, 11:03 AM Clinical Pharmacist Monday - Friday phone -  [phone] Saturday - Sunday phone - [phone]

## 2023-01-04 ENCOUNTER — Emergency Department: Payer: 59

## 2023-01-04 ENCOUNTER — Emergency Department
Admission: EM | Admit: 2023-01-04 | Discharge: 2023-01-04 | Disposition: A | Discharge status: Home / Self Care | Payer: 59 | Attending: Emergency Medicine | Admitting: Emergency Medicine

## 2023-01-04 ENCOUNTER — Other Ambulatory Visit: Payer: Self-pay

## 2023-01-04 DIAGNOSIS — Z1152 Encounter for screening for COVID-19: Secondary | ICD-10-CM | POA: Insufficient documentation

## 2023-01-04 DIAGNOSIS — R0602 Shortness of breath: Secondary | ICD-10-CM | POA: Insufficient documentation

## 2023-01-04 DIAGNOSIS — R112 Nausea with vomiting, unspecified: Secondary | ICD-10-CM

## 2023-01-04 DIAGNOSIS — R42 Dizziness and giddiness: Secondary | ICD-10-CM | POA: Insufficient documentation

## 2023-01-04 DIAGNOSIS — R4182 Altered mental status, unspecified: Secondary | ICD-10-CM | POA: Diagnosis present

## 2023-01-04 LAB — URINE DRUG SCREEN, QUALITATIVE (ARMC ONLY)
Amphetamines, Ur Screen: NOT DETECTED
Barbiturates, Ur Screen: NOT DETECTED
Benzodiazepine, Ur Scrn: NOT DETECTED
Cannabinoid 50 Ng, Ur ~~LOC~~: NOT DETECTED
Cocaine Metabolite,Ur ~~LOC~~: NOT DETECTED
MDMA (Ecstasy)Ur Screen: NOT DETECTED
Methadone Scn, Ur: NOT DETECTED
Opiate, Ur Screen: NOT DETECTED
Phencyclidine (PCP) Ur S: NOT DETECTED
Tricyclic, Ur Screen: NOT DETECTED

## 2023-01-04 LAB — URINALYSIS, ROUTINE W REFLEX MICROSCOPIC
Bacteria, UA: NONE SEEN
Bilirubin Urine: NEGATIVE
Glucose, UA: NEGATIVE mg/dL
Hgb urine dipstick: NEGATIVE
Ketones, ur: NEGATIVE mg/dL
Nitrite: POSITIVE — AB
Protein, ur: NEGATIVE mg/dL
Specific Gravity, Urine: 1.013 (ref 1.005–1.030)
pH: 7 (ref 5.0–8.0)

## 2023-01-04 LAB — COMPREHENSIVE METABOLIC PANEL
ALT: 36 U/L (ref 0–44)
AST: 29 U/L (ref 15–41)
Albumin: 4.3 g/dL (ref 3.5–5.0)
Alkaline Phosphatase: 90 U/L (ref 38–126)
Anion gap: 10 (ref 5–15)
BUN: 15 mg/dL (ref 6–20)
CO2: 22 mmol/L (ref 22–32)
Calcium: 8.7 mg/dL — ABNORMAL LOW (ref 8.9–10.3)
Chloride: 104 mmol/L (ref 98–111)
Creatinine, Ser: 0.97 mg/dL (ref 0.61–1.24)
GFR, Estimated: >60 mL/min (ref >60)
Glucose, Bld: 136 mg/dL — ABNORMAL HIGH (ref 70–99)
Potassium: 4 mmol/L (ref 3.5–5.1)
Sodium: 136 mmol/L (ref 135–145)
Total Bilirubin: 0.6 mg/dL (ref 0.3–1.2)
Total Protein: 7.8 g/dL (ref 6.5–8.1)

## 2023-01-04 LAB — RESP PANEL BY RT-PCR (RSV, FLU A&B, COVID)  RVPGX2
Influenza A by PCR: NEGATIVE
Influenza B by PCR: NEGATIVE
Resp Syncytial Virus by PCR: NEGATIVE
SARS Coronavirus 2 by RT PCR: NEGATIVE

## 2023-01-04 LAB — CBC WITH DIFFERENTIAL/PLATELET
Abs Immature Granulocytes: 0.03 10*3/uL (ref 0.00–0.07)
Basophils Absolute: 0 10*3/uL (ref 0.0–0.1)
Basophils Relative: 0 %
Eosinophils Absolute: 0.1 10*3/uL (ref 0.0–0.5)
Eosinophils Relative: 1 %
HCT: 48 % (ref 39.0–52.0)
Hemoglobin: 15.8 g/dL (ref 13.0–17.0)
Immature Granulocytes: 0 %
Lymphocytes Relative: 14 %
Lymphs Abs: 1.4 10*3/uL (ref 0.7–4.0)
MCH: 25.4 pg — ABNORMAL LOW (ref 26.0–34.0)
MCHC: 32.9 g/dL (ref 30.0–36.0)
MCV: 77.3 fL — ABNORMAL LOW (ref 80.0–100.0)
Monocytes Absolute: 0.8 10*3/uL (ref 0.1–1.0)
Monocytes Relative: 8 %
Neutro Abs: 7.4 10*3/uL (ref 1.7–7.7)
Neutrophils Relative %: 77 %
Platelets: 222 10*3/uL (ref 150–400)
RBC: 6.21 MIL/uL — ABNORMAL HIGH (ref 4.22–5.81)
RDW: 15.2 % (ref 11.5–15.5)
WBC: 9.8 10*3/uL (ref 4.0–10.5)
nRBC: 0 % (ref 0.0–0.2)

## 2023-01-04 LAB — TROPONIN I (HIGH SENSITIVITY)
Troponin I (High Sensitivity): 2 ng/L (ref <18)
Troponin I (High Sensitivity): 3 ng/L (ref <18)

## 2023-01-04 LAB — LACTIC ACID, PLASMA
Lactic Acid, Venous: 2.6 mmol/L (ref 0.5–1.9)
Lactic Acid, Venous: 1.7 mmol/L (ref 0.5–1.9)

## 2023-01-04 MED ORDER — MECLIZINE HCL 50 MG PO TABS: 50.0000 mg | ORAL_TABLET | Freq: Three times a day (TID) | ORAL | 0 refills | Status: DC | PRN

## 2023-01-04 MED ORDER — ONDANSETRON 8 MG PO TBDP: 8.0000 mg | ORAL_TABLET | Freq: Three times a day (TID) | ORAL | 0 refills | Status: AC | PRN

## 2023-01-04 MED ORDER — ONDANSETRON HCL 4 MG/2ML IJ SOLN
4.0000 mg | Freq: Once | INTRAMUSCULAR | Status: AC
  Administered 2023-01-04: 4 mg via INTRAVENOUS
  Filled 2023-01-04: qty 2

## 2023-01-04 MED ORDER — SODIUM CHLORIDE 0.9 % IV BOLUS
1000.0000 mL | Freq: Once | INTRAVENOUS | Status: AC
  Administered 2023-01-04: 1000 mL via INTRAVENOUS

## 2023-01-04 NOTE — ED Notes (Signed)
ED Provider at bedside. 

## 2023-01-04 NOTE — ED Triage Notes (Signed)
Patient was found by neighbor, vomiting and complaining of headache.

## 2023-01-04 NOTE — ED Provider Notes (Signed)
[facility] Provider Note   Event Date/Time   First MD Initiated Contact with Patient 01/04/23 1545     (approximate) History  Altered Mental Status  HPI Jesus Hawkins is a 57 y.o. male with a past medical history of schizencephaly and vertigo who presents for dizziness, nausea, and vomiting that began spontaneously approximately 2 hours prior to arrival.  Patient called a neighbor in order to call EMS.  EMS states that they arrived with patient laying on the floor and vomiting onto the floor in front of his face.  Patient was minimally responsive for EMS during transport.  Patient is moaning and has mumbled speech.  Further history and review of systems are unable to be obtained at this time secondary to patient's mental status   Physical Exam  Triage Vital Signs: ED Triage Vitals  Enc Vitals Group     BP      Pulse      Resp      Temp      Temp src      SpO2      Weight      Height      Head Circumference      Peak Flow      Pain Score      Pain Loc      Pain Edu?      Excl. in [place]?    Most recent vital signs: Vitals:   01/04/23 1930 01/04/23 2023  BP: 110/67 108/66  Pulse: (!) 59 62  Resp: 15 18  Temp:    SpO2: 96% 97%   General: Awake, cooperative.  GCS 14 CV:  Good peripheral perfusion.  Resp:  Normal effort.  Abd:  No distention.  Other:  Ill-appearing middle-aged African-American male laying in bed somnolent but arousable to voice ED Results / Procedures / Treatments  Labs (all labs ordered are listed, but only abnormal results are displayed) Labs Reviewed  LACTIC ACID, PLASMA - Abnormal; Notable for the following components:      Result Value   Lactic Acid, Venous 2.6 (*)    All other components within normal limits  URINALYSIS, ROUTINE W REFLEX MICROSCOPIC - Abnormal; Notable for the following components:   Color, Urine YELLOW (*)    APPearance CLOUDY (*)    Nitrite POSITIVE (*)    Leukocytes,Ua SMALL (*)    All other components  within normal limits  CBC WITH DIFFERENTIAL/PLATELET - Abnormal; Notable for the following components:   RBC 6.21 (*)    MCV 77.3 (*)    MCH 25.4 (*)    All other components within normal limits  COMPREHENSIVE METABOLIC PANEL - Abnormal; Notable for the following components:   Glucose, Bld 136 (*)    Calcium 8.7 (*)    All other components within normal limits  RESP PANEL BY RT-PCR (RSV, FLU A&B, COVID)  RVPGX2  CULTURE, BLOOD (ROUTINE X 2)  CULTURE, BLOOD (ROUTINE X 2)  LACTIC ACID, PLASMA  URINE DRUG SCREEN, QUALITATIVE ([facility] ONLY)  CBC WITH DIFFERENTIAL/PLATELET  TROPONIN I (HIGH SENSITIVITY)  TROPONIN I (HIGH SENSITIVITY)   EKG ED ECG REPORT I, Naaman Plummer, the attending physician, personally viewed and interpreted this ECG. Date: 01/04/2023 EKG Time: 1552 Rate: 55 Rhythm: normal sinus rhythm QRS Axis: normal Intervals: normal ST/T Wave abnormalities: normal Narrative Interpretation: no evidence of acute ischemia RADIOLOGY ED MD interpretation: CT of the head without contrast interpreted by me shows no evidence of acute abnormalities including no  intracerebral hemorrhage, obvious masses, or significant edema  One-view portable chest x-ray interpreted by me shows no evidence of acute abnormalities including no pneumonia, pneumothorax, or widened mediastinum -Agree with radiology assessment Official radiology report(s): CT Head Wo Contrast  Result Date: 01/04/2023 CLINICAL DATA:  Mental status change.  Headache. EXAM: CT HEAD WITHOUT CONTRAST TECHNIQUE: Contiguous axial images were obtained from the base of the skull through the vertex without intravenous contrast. RADIATION DOSE REDUCTION: This exam was performed according to the departmental dose-optimization program which includes automated exposure control, adjustment of the mA and/or kV according to patient size and/or use of iterative reconstruction technique. COMPARISON:  CT scan dated October 29, 2022 FINDINGS:  Brain: No subdural, epidural, or subarachnoid hemorrhage. Again noted is a left hemispheric schizencephaly with a large, chronic overlying subdural collection. Direct comparison is somewhat difficult due to difference in positioning. However, there has been no significant change. Cerebellum, brainstem, and basal cisterns are stable. Ventricles and sulci are stable. No acute cortical ischemia or infarct. Vascular: No hyperdense vessel or unexpected calcification. Skull: Normal. Negative for fracture or focal lesion. Sinuses/Orbits: No acute finding. Other: None. IMPRESSION: 1. No acute intracranial abnormalities. 2. Left hemispheric schizencephaly with a large, chronic overlying subdural collection. Direct comparison is somewhat difficult due to difference in positioning. However, there has been no significant change since October 29, 2022. Electronically Signed   By: Dorise Bullion III M.D.   On: 01/04/2023 16:40   DG Chest Port 1 View  Result Date: 01/04/2023 CLINICAL DATA:  Shortness of breath.  Altered mental status. EXAM: PORTABLE CHEST 1 VIEW COMPARISON:  03/19/2010 FINDINGS: Poor inspiration. Mildly increased elevation of the right hemidiaphragm. Normal sized heart. Clear lungs. Right glenohumeral and left AC joint degenerative changes. IMPRESSION: No acute abnormality. Electronically Signed   By: Claudie Revering M.D.   On: 01/04/2023 16:14   PROCEDURES: Critical Care performed: No .1-3 Lead EKG Interpretation  Performed by: Naaman Plummer, MD Authorized by: Naaman Plummer, MD     Interpretation: normal     ECG rate:  71   ECG rate assessment: normal     Rhythm: sinus rhythm     Ectopy: none     Conduction: normal    MEDICATIONS ORDERED IN ED: Medications  ondansetron (ZOFRAN) injection 4 mg (4 mg Intravenous Given 01/04/23 1631)  sodium chloride 0.9 % bolus 1,000 mL (0 mLs Intravenous Stopped 01/04/23 1739)   IMPRESSION / MDM / ASSESSMENT AND PLAN / ED COURSE  I reviewed the triage  vital signs and the nursing notes.                             The patient is on the cardiac monitor to evaluate for evidence of arrhythmia and/or significant heart rate changes. Patient's presentation is most consistent with acute presentation with potential threat to life or bodily function. Patient presents for acute nausea/vomiting The cause of the patients symptoms is not clear, but the patient is overall well appearing and is suspected to have a transient course of illness.  Given History and Exam there does not appear to be an emergent cause of the symptoms such as small bowel obstruction, coronary syndrome, bowel ischemia, DKA, pancreatitis, appendicitis, other acute abdomen or other emergent problem.  Reassessment: After treatment, the patient is feeling much better, tolerating PO fluids, and shows no signs of dehydration.   Disposition: Discharge home with prompt primary care physician follow up in  the next 48 hours. Strict return precautions discussed.   FINAL CLINICAL IMPRESSION(S) / ED DIAGNOSES   Final diagnoses:  Nausea and vomiting, unspecified vomiting type  Lightheadedness   Rx / DC Orders   ED Discharge Orders          Ordered    ondansetron (ZOFRAN-ODT) 8 MG disintegrating tablet  Every 8 hours PRN        01/04/23 2017    meclizine (ANTIVERT) 50 MG tablet  3 times daily PRN        01/04/23 2017           Note:  This document was prepared using Dragon voice recognition software and may include unintentional dictation errors.   Naaman Plummer, MD 01/04/23 2150

## 2023-01-06 ENCOUNTER — Encounter: Payer: Self-pay | Admitting: Emergency Medicine

## 2023-01-06 ENCOUNTER — Emergency Department
Admission: EM | Admit: 2023-01-06 | Discharge: 2023-01-06 | Disposition: A | Discharge status: Home / Self Care | Payer: 59 | Attending: Emergency Medicine | Admitting: Emergency Medicine

## 2023-01-06 ENCOUNTER — Other Ambulatory Visit: Payer: Self-pay

## 2023-01-06 DIAGNOSIS — R42 Dizziness and giddiness: Secondary | ICD-10-CM

## 2023-01-06 DIAGNOSIS — Q046 Congenital cerebral cysts: Secondary | ICD-10-CM | POA: Diagnosis not present

## 2023-01-06 DIAGNOSIS — N3000 Acute cystitis without hematuria: Secondary | ICD-10-CM

## 2023-01-06 LAB — CBG MONITORING, ED: Glucose-Capillary: 117 mg/dL — ABNORMAL HIGH (ref 70–99)

## 2023-01-06 LAB — BASIC METABOLIC PANEL
Anion gap: 11 (ref 5–15)
BUN: 17 mg/dL (ref 6–20)
CO2: 23 mmol/L (ref 22–32)
Calcium: 9.3 mg/dL (ref 8.9–10.3)
Chloride: 103 mmol/L (ref 98–111)
Creatinine, Ser: 1.01 mg/dL (ref 0.61–1.24)
GFR, Estimated: >60 mL/min (ref >60)
Glucose, Bld: 111 mg/dL — ABNORMAL HIGH (ref 70–99)
Potassium: 4.1 mmol/L (ref 3.5–5.1)
Sodium: 137 mmol/L (ref 135–145)

## 2023-01-06 LAB — URINALYSIS, ROUTINE W REFLEX MICROSCOPIC
Bilirubin Urine: NEGATIVE
Glucose, UA: NEGATIVE mg/dL
Hgb urine dipstick: NEGATIVE
Ketones, ur: NEGATIVE mg/dL
Nitrite: POSITIVE — AB
Protein, ur: NEGATIVE mg/dL
Specific Gravity, Urine: 1.015 (ref 1.005–1.030)
pH: 7 (ref 5.0–8.0)

## 2023-01-06 LAB — CBC
HCT: 47.6 % (ref 39.0–52.0)
Hemoglobin: 15.3 g/dL (ref 13.0–17.0)
MCH: 24.9 pg — ABNORMAL LOW (ref 26.0–34.0)
MCHC: 32.1 g/dL (ref 30.0–36.0)
MCV: 77.4 fL — ABNORMAL LOW (ref 80.0–100.0)
Platelets: 185 10*3/uL (ref 150–400)
RBC: 6.15 MIL/uL — ABNORMAL HIGH (ref 4.22–5.81)
RDW: 15.6 % — ABNORMAL HIGH (ref 11.5–15.5)
WBC: 8.8 10*3/uL (ref 4.0–10.5)
nRBC: 0 % (ref 0.0–0.2)

## 2023-01-06 MED ORDER — SODIUM CHLORIDE 0.9 % IV SOLN
1.0000 g | Freq: Once | INTRAVENOUS | Status: AC
  Administered 2023-01-06: 1 g via INTRAVENOUS
  Filled 2023-01-06: qty 10

## 2023-01-06 MED ORDER — CEPHALEXIN 500 MG PO CAPS: 500.0000 mg | ORAL_CAPSULE | Freq: Four times a day (QID) | ORAL | 0 refills | Status: AC

## 2023-01-06 MED ORDER — SODIUM CHLORIDE 0.9 % IV BOLUS
500.0000 mL | Freq: Once | INTRAVENOUS | Status: AC
  Administered 2023-01-06: 500 mL via INTRAVENOUS

## 2023-01-06 NOTE — ED Notes (Signed)
Awaiting IV fluids/abx to finish prior to DC

## 2023-01-06 NOTE — ED Triage Notes (Signed)
Presents with dizziness   states was seen yesterday for same

## 2023-01-06 NOTE — ED Notes (Signed)
Patient reports dizziness and headache. Patient reports productive cough for a couple months

## 2023-01-06 NOTE — ED Provider Notes (Signed)
[facility] Provider Note  Patient Contact: 6:31 PM (approximate)   History   Dizziness   HPI  Jesus Hawkins is a 57 y.o. male who presents the emergency department complaining of ongoing dizziness, feeling "off" and headache.  Patient states that the headache has been going on for "years."  He is known to have schizencephaly and vertigo.  Patient was seen yesterday for similar complaints.  Reportedly patient was having decreased responsiveness and emesis yesterday as well.  Patient was evaluated yesterday extensively with CT scans, EKG, labs, urine, chest x-ray.  Patient's workup was overall reassuring.  Ongoing schizencephaly with chronic overlying subdural collection.  This was unchanged from previous scan.  Patient had reassuring labs.  There was some nitrites and leukocytes in the urine.  Patient had received fluids, antiemetics and felt back to baseline.  Patient was discharged with meclizine and Zofran.  Patient arrives today with ongoing headache, ongoing dizziness.  No reports of emesis currently.  Patient states that he does not remember what they told him yesterday.  Does not appear that patient has picked up medications from yesterday.     Physical Exam   Triage Vital Signs: ED Triage Vitals  Enc Vitals Group     BP 01/06/23 1653 (!) 134/93     Pulse Rate 01/06/23 1653 72     Resp 01/06/23 1653 17     Temp 01/06/23 1653 97.7 F (36.5 C)     Temp Source 01/06/23 1653 Oral     SpO2 01/06/23 1653 97 %     Weight 01/06/23 1615 218 lb 4.8 oz (99 kg)     Height 01/06/23 1615 '5\' 11"'$  (1.803 m)     Head Circumference --      Peak Flow --      Pain Score --      Pain Loc --      Pain Edu? --      Excl. in [place]? --     Most recent vital signs: Vitals:   01/06/23 2030 01/06/23 2100  BP: 113/83 127/88  Pulse: 68   Resp:    Temp:    SpO2: 95%      General: Alert and in no acute distress. Eyes:  PERRL. EOMI. Head: No acute traumatic  findings ENT:      Ears:       Nose: No congestion/rhinnorhea.      Mouth/Throat: Mucous membranes are moist. Neck: No stridor.  Cardiovascular:  Good peripheral perfusion Respiratory: Normal respiratory effort without tachypnea or retractions. Lungs CTAB. Good air entry to the bases with no decreased or absent breath sounds Gastrointestinal: Bowel sounds 4 quadrants. Soft and nontender to palpation. No guarding or rigidity. No palpable masses. No distention. No CVA tenderness. Musculoskeletal: Full range of motion to all extremities.  Neurologic:  No gross focal neurologic deficits are appreciated. Neurologically intact. Skin:   No rash noted Other:   ED Results / Procedures / Treatments   Labs (all labs ordered are listed, but only abnormal results are displayed) Labs Reviewed  BASIC METABOLIC PANEL - Abnormal; Notable for the following components:      Result Value   Glucose, Bld 111 (*)    All other components within normal limits  CBC - Abnormal; Notable for the following components:   RBC 6.15 (*)    MCV 77.4 (*)    MCH 24.9 (*)    RDW 15.6 (*)    All other components within normal limits  URINALYSIS, ROUTINE W REFLEX MICROSCOPIC - Abnormal; Notable for the following components:   Color, Urine YELLOW (*)    APPearance CLOUDY (*)    Nitrite POSITIVE (*)    Leukocytes,Ua TRACE (*)    Bacteria, UA RARE (*)    All other components within normal limits  CBG MONITORING, ED - Abnormal; Notable for the following components:   Glucose-Capillary 117 (*)    All other components within normal limits     EKG     RADIOLOGY    No results found.  PROCEDURES:  Critical Care performed: No  Procedures   MEDICATIONS ORDERED IN ED: Medications  cefTRIAXone (ROCEPHIN) 1 g in sodium chloride 0.9 % 100 mL IVPB (has no administration in time range)  sodium chloride 0.9 % bolus 500 mL (has no administration in time range)     IMPRESSION / MDM / ASSESSMENT AND PLAN /  ED COURSE  I reviewed the triage vital signs and the nursing notes.                                 Differential diagnosis includes, but is not limited to, vertigo, infection, intracranial hemorrhage, flu, COVID, UTI, complications from schizencephaly  Patient's presentation is most consistent with acute presentation with potential threat to life or bodily function.   Patient's diagnosis is consistent with UTI.  Patient presents emergency department feeling "off" with dizziness.  Patient had similar symptoms yesterday, was seen yesterday with an extensive workup.  Overall his workup looked reassuring yesterday.  Patient did have some nitrates and leukocytes in his urine but no bacteria yesterday.  Patient returns still feeling "off."  He denies any headache, vision changes, unilateral weakness, chest pain, shortness of breath, abdominal pain.  I did not repeat imaging today as patient has had reassuring imaging yesterday with worse symptoms yesterday than today.  Did repeat labs and urinalysis.  Today patient has nitrates, leukocytes and bacteria in his urine.  I suspect that this is the result of patient's increased symptoms.  He does have chronic issues due to known schizencephaly but given the infection I think this has likely increased these chronic symptoms..  The last time patient had similar symptoms was in November, this also resulted as a UTI.  As such I will treat UTI today.  He has symptom control medication at home for vertigo-like symptoms.  Concerning signs and symptoms and return precautions discussed with the patient.  Patient was agreeable with this plan.  Follow-up primary care as needed..  Patient is given ED precautions to return to the ED for any worsening or new symptoms.     FINAL CLINICAL IMPRESSION(S) / ED DIAGNOSES   Final diagnoses:  Acute cystitis without hematuria  Dizziness  Schizencephaly ([place])     Rx / DC Orders   ED Discharge Orders          Ordered     cephALEXin (KEFLEX) 500 MG capsule  4 times daily        01/06/23 2155             Note:  This document was prepared using Dragon voice recognition software and may include unintentional dictation errors.   Brynda Peon 01/06/23 2156    Harvest Dark, MD 01/06/23 2250

## 2023-01-09 LAB — CULTURE, BLOOD (ROUTINE X 2)
Culture: NO GROWTH
Culture: NO GROWTH
Special Requests: ADEQUATE
Special Requests: ADEQUATE

## 2023-05-09 ENCOUNTER — Emergency Department: Payer: 59

## 2023-05-09 ENCOUNTER — Other Ambulatory Visit: Payer: Self-pay

## 2023-05-09 ENCOUNTER — Emergency Department
Admission: EM | Admit: 2023-05-09 | Discharge: 2023-05-09 | Disposition: A | Discharge status: Home / Self Care | Payer: 59 | Attending: Emergency Medicine | Admitting: Emergency Medicine

## 2023-05-09 DIAGNOSIS — N3000 Acute cystitis without hematuria: Secondary | ICD-10-CM | POA: Insufficient documentation

## 2023-05-09 DIAGNOSIS — R42 Dizziness and giddiness: Secondary | ICD-10-CM | POA: Insufficient documentation

## 2023-05-09 LAB — BASIC METABOLIC PANEL
Anion gap: 6 (ref 5–15)
BUN: 13 mg/dL (ref 6–20)
CO2: 24 mmol/L (ref 22–32)
Calcium: 9.1 mg/dL (ref 8.9–10.3)
Chloride: 107 mmol/L (ref 98–111)
Creatinine, Ser: 0.85 mg/dL (ref 0.61–1.24)
GFR, Estimated: >60 mL/min (ref >60)
Glucose, Bld: 103 mg/dL — ABNORMAL HIGH (ref 70–99)
Potassium: 3.5 mmol/L (ref 3.5–5.1)
Sodium: 137 mmol/L (ref 135–145)

## 2023-05-09 LAB — CBC
HCT: 48.1 % (ref 39.0–52.0)
Hemoglobin: 16.1 g/dL (ref 13.0–17.0)
MCH: 25.6 pg — ABNORMAL LOW (ref 26.0–34.0)
MCHC: 33.5 g/dL (ref 30.0–36.0)
MCV: 76.6 fL — ABNORMAL LOW (ref 80.0–100.0)
Platelets: 212 10*3/uL (ref 150–400)
RBC: 6.28 MIL/uL — ABNORMAL HIGH (ref 4.22–5.81)
RDW: 15 % (ref 11.5–15.5)
WBC: 8.1 10*3/uL (ref 4.0–10.5)
nRBC: 0 % (ref 0.0–0.2)

## 2023-05-09 LAB — URINALYSIS, COMPLETE (UACMP) WITH MICROSCOPIC
Bilirubin Urine: NEGATIVE
Glucose, UA: NEGATIVE mg/dL
Hgb urine dipstick: NEGATIVE
Ketones, ur: NEGATIVE mg/dL
Nitrite: POSITIVE — AB
Protein, ur: NEGATIVE mg/dL
Specific Gravity, Urine: 1.003 — ABNORMAL LOW (ref 1.005–1.030)
pH: 7 (ref 5.0–8.0)

## 2023-05-09 MED ORDER — LEVOFLOXACIN 500 MG PO TABS
250.0000 mg | ORAL_TABLET | Freq: Once | ORAL | Status: AC
  Administered 2023-05-09: 250 mg via ORAL
  Filled 2023-05-09: qty 1

## 2023-05-09 MED ORDER — LEVOFLOXACIN 250 MG PO TABS: 250.0000 mg | ORAL_TABLET | Freq: Every day | ORAL | 0 refills | Status: AC

## 2023-05-09 MED ORDER — MECLIZINE HCL 25 MG PO TABS: 25.0000 mg | ORAL_TABLET | Freq: Three times a day (TID) | ORAL | 0 refills | Status: AC | PRN

## 2023-05-09 MED ORDER — MECLIZINE HCL 25 MG PO TABS
12.5000 mg | ORAL_TABLET | Freq: Once | ORAL | Status: AC
  Administered 2023-05-09: 12.5 mg via ORAL
  Filled 2023-05-09: qty 1

## 2023-05-09 NOTE — ED Provider Notes (Addendum)
[facility] Provider Note   CSN: [id number] Arrival date & time: 05/09/23  1536     History  Chief Complaint  Patient presents with   Dizziness    Jesus Hawkins is a 57 y.o. male.  With history of schixencephaly with large chronic subdural collection presents to the emergency department evaluation of vertigo.  Patient states around 1 PM he started to have slight headache, nausea, sensation that the room was spinning.  He has had the symptoms in the past over the last 4 to 5 months.  He is presented to the ER twice with similar symptoms and noted to have vertigo and UTI.  He denies any urinary symptoms, fevers, chills, body aches, cough, abdominal pain, back pain.  He is tolerating p.o. well but admits to not having much fluid stream.  He denies any vision changes, difficulty swallowing, difficulty speaking or denies any upper or lower extremity weakness.  He has ran out of his meclizine which typically helps with his dizziness.  He is requesting meclizine.  HPI     Home Medications Prior to Admission medications   Medication Sig Start Date End Date Taking? Authorizing Provider  levofloxacin (LEVAQUIN) 250 MG tablet Take 1 tablet (250 mg total) by mouth daily for 5 days. 05/09/23 05/14/23 Yes Evon Slack, PA-C  meclizine (ANTIVERT) 25 MG tablet Take 1 tablet (25 mg total) by mouth 3 (three) times daily as needed for dizziness. 05/09/23  Yes Evon Slack, PA-C  amLODipine (NORVASC) 5 MG tablet Take 5 mg by mouth daily.    [provider]  atorvastatin (LIPITOR) 10 MG tablet Take 10 mg by mouth daily. 12/27/18   [provider]  cephALEXin (KEFLEX) 500 MG capsule Take 1 capsule (500 mg total) by mouth 4 (four) times daily. 01/06/23   Cuthriell, Delorise Royals, PA-C  meloxicam (MOBIC) 15 MG tablet Take 15 mg by mouth daily. 10/13/18   [provider]  niacin 500 MG tablet Take 500 mg by mouth daily.     [provider]  OMEGA-3 FATTY ACIDS-VITAMIN E PO Take 1,000 mg by mouth 2 (two) times daily.    [provider]  ondansetron (ZOFRAN-ODT) 4 MG disintegrating tablet Take 1 tablet (4 mg total) by mouth every 8 (eight) hours as needed for nausea or vomiting. 10/29/22   Willy Eddy, MD  ondansetron (ZOFRAN-ODT) 8 MG disintegrating tablet Take 1 tablet (8 mg total) by mouth every 8 (eight) hours as needed for nausea or vomiting. 01/04/23   Merwyn Katos, MD  tamsulosin (FLOMAX) 0.4 MG CAPS capsule Take 1 capsule (0.4 mg total) by mouth daily after supper. 03/16/20   Jerilee Field, MD      Allergies    Patient has no known allergies.    Review of Systems   Review of Systems  Physical Exam Updated Vital Signs BP (!) 132/92 (BP Location: Right Arm)   Pulse 87   Temp 98.1 F (36.7 C) (Oral)   Resp 17   Ht 6\' 1"  (1.854 m)   Wt 93.8 kg   SpO2 95%   BMI 27.28 kg/m  Physical Exam Constitutional:      Appearance: He is well-developed and normal weight.  HENT:     Head: Normocephalic and atraumatic.     Nose: Nose normal.     Mouth/Throat:     Pharynx: No oropharyngeal exudate or posterior oropharyngeal erythema.  Eyes:     Conjunctiva/sclera: Conjunctivae normal.  Cardiovascular:     Rate and Rhythm: Normal rate and regular rhythm.     Pulses: Normal pulses.  Pulmonary:     Effort: Pulmonary effort is normal. No respiratory distress.  Abdominal:     General: Abdomen is flat. Bowel sounds are normal. There is no distension.     Tenderness: There is no abdominal tenderness. There is no guarding.  Musculoskeletal:        General: Normal range of motion.     Cervical back: Normal range of motion.  Skin:    General: Skin is warm.     Findings: No rash.  Neurological:     General: No focal deficit present.     Mental Status: He is alert. Mental status is at baseline. He is disoriented.     Cranial Nerves: No cranial nerve deficit.     Motor: No weakness.     Gait: Gait  normal.  Psychiatric:        Mood and Affect: Mood normal.        Behavior: Behavior normal.        Thought Content: Thought content normal.        Judgment: Judgment normal.     ED Results / Procedures / Treatments   Labs (all labs ordered are listed, but only abnormal results are displayed) Labs Reviewed  BASIC METABOLIC PANEL - Abnormal; Notable for the following components:      Result Value   Glucose, Bld 103 (*)    All other components within normal limits  CBC - Abnormal; Notable for the following components:   RBC 6.28 (*)    MCV 76.6 (*)    MCH 25.6 (*)    All other components within normal limits  URINALYSIS, COMPLETE (UACMP) WITH MICROSCOPIC - Abnormal; Notable for the following components:   Color, Urine STRAW (*)    APPearance CLEAR (*)    Specific Gravity, Urine 1.003 (*)    Nitrite POSITIVE (*)    Leukocytes,Ua MODERATE (*)    Bacteria, UA RARE (*)    All other components within normal limits  URINE CULTURE    EKG None  Radiology No results found.  Procedures Procedures    Medications Ordered in ED Medications  levofloxacin (LEVAQUIN) tablet 250 mg (has no administration in time range)  meclizine (ANTIVERT) tablet 12.5 mg (12.5 mg Oral Given 05/09/23 1801)    ED Course/ Medical Decision Making/ A&P                             Medical Decision Making Amount and/or Complexity of Data Reviewed Labs: ordered. Radiology: ordered.  Risk Prescription drug management.   57 year old male with history of vertigo, history of urinary tract infection presents to the emergency department for evaluation of headache, vertigo symptoms.  Patient has had vertigo symptoms over the last 6 months.  He typically gets relief with meclizine, states he ran out of his meclizine.  He was given meclizine here in the ED and did see improvement of his symptoms.  He has no neurological deficit on exam.  CT of his head was read as negative, no acute changes.  He has had  some frequent urinary tract infections and urinalysis today consistent with UTI with bacteria, nitrites, leukocytes and elevated WBCs.  His previous cultures have showed Morganella Lequita Halt I that is sensitive to Levaquin.  Will place on Levaquin today and have him take daily for 5 days.  Will also refill his meclizine.  Patient stable and ready for discharge to home.  Patient's vital signs are stable.  Dizziness has resolved. Final Clinical Impression(s) / ED Diagnoses Final diagnoses:  Vertigo  Acute cystitis without hematuria    Rx / DC Orders ED Discharge Orders          Ordered    levofloxacin (LEVAQUIN) 250 MG tablet  Daily        05/09/23 1814    meclizine (ANTIVERT) 25 MG tablet  3 times daily PRN        05/09/23 1814              Evon Slack, PA-C 05/09/23 1900    Evon Slack, PA-C 05/09/23 1901    Dionne Bucy, MD 05/09/23 1924

## 2023-05-09 NOTE — ED Notes (Signed)
Pt to ED via [facility] in for dizziness, headache and nausea that started around 230pm today. Pt has hx/o Vertigo and was given Meclizine for it. Pt ran out of his Meclizine. Pt also has a hx/o right sided weakness. No CVA history.

## 2023-05-09 NOTE — Discharge Instructions (Signed)
Please take medications as prescribed.  Return to the ER for any dizziness, fevers, abdominal pain, back pain, nausea, vomiting, worsening symptoms or any urgent changes in your health.

## 2023-05-09 NOTE — ED Notes (Signed)
Lab draw attempted by 2 staff x2 without success lab called for blood draw.

## 2023-05-09 NOTE — ED Triage Notes (Addendum)
Pt states that at around 1 pm he started to have a headache, nausea, dizziness and felt unsteady on feet. Pt reports caretaker was with him. Pt is AOX4, NAD noted. Pt was prescribed meds for vertigo and states he's out. No dysthagia noted, smile is symmetric, strength and sensation equal bilaterally (right arm baseline contracture), PERRLA noted 2 mm bilaterally.

## 2023-05-12 LAB — URINE CULTURE: Culture: >=100000 — AB

## 2023-05-13 ENCOUNTER — Telehealth: Payer: Self-pay | Admitting: Emergency Medicine

## 2023-05-13 NOTE — Telephone Encounter (Signed)
Pharmacy notified me that patient's urine culture is growing Morganella morganii which is not sensitive to any p.o. antibiotics.   I reviewed patient's ED note he was here with vertigo and had similar presentation several months prior where he presented with vertigo and was found to have UTI.  Patient however did not have any urinary symptoms or started on Levaquin. I called the patient and he is not having any urinary symptoms.  He also denies abdominal pain flank pain fevers and his vertigo is improved.  At this point I think that patient is asymptomatic and do not feel that he needs to come back to the ED for IV antibiotics.  I did discuss the findings with him and recommended that if he develop fevers abdominal pain back pain or flank pain that he does return to the ED.

## 2024-08-06 ENCOUNTER — Emergency Department

## 2024-08-06 ENCOUNTER — Emergency Department
Admission: EM | Admit: 2024-08-06 | Discharge: 2024-08-07 | Disposition: A | Discharge status: Home / Self Care | Attending: Emergency Medicine | Admitting: Emergency Medicine

## 2024-08-06 ENCOUNTER — Other Ambulatory Visit: Payer: Self-pay

## 2024-08-06 DIAGNOSIS — R55 Syncope and collapse: Secondary | ICD-10-CM | POA: Insufficient documentation

## 2024-08-06 DIAGNOSIS — R404 Transient alteration of awareness: Secondary | ICD-10-CM | POA: Diagnosis not present

## 2024-08-06 DIAGNOSIS — N3000 Acute cystitis without hematuria: Secondary | ICD-10-CM | POA: Diagnosis not present

## 2024-08-06 DIAGNOSIS — R1084 Generalized abdominal pain: Secondary | ICD-10-CM | POA: Diagnosis not present

## 2024-08-06 DIAGNOSIS — Z79899 Other long term (current) drug therapy: Secondary | ICD-10-CM | POA: Diagnosis not present

## 2024-08-06 DIAGNOSIS — R112 Nausea with vomiting, unspecified: Secondary | ICD-10-CM | POA: Diagnosis present

## 2024-08-06 HISTORY — DX: Congenital cerebral cysts: Q04.6

## 2024-08-06 MED ORDER — LACTATED RINGERS IV BOLUS
1000.0000 mL | Freq: Once | INTRAVENOUS | Status: AC
  Administered 2024-08-07: 1000 mL via INTRAVENOUS

## 2024-08-06 NOTE — ED Triage Notes (Addendum)
 BIB [facility] after the patient called EMS for dizziness, emesis, abdominal pain and lethargy. Per EMS, when they arrived to the home, the patient was laying in bed unresponsive. On arrival, the patient is not verbally responsive, but responds to painful stimuli.

## 2024-08-06 NOTE — ED Provider Notes (Signed)
 [facility] Provider Note    Event Date/Time   First MD Initiated Contact with Patient 08/06/24 2328     (approximate)   History   Emesis, Fatigue, and Dizziness  Level 5 caveat:  history/ROS limited by acute/critical illness  HPI Jesus Hawkins is a 58 y.o. male with a history of schizencephaly with some chronic right-sided deficits.  He presents by EMS altered/confused, having vomited, and initially complaining of generally feeling ill.  He has had multiple visits over the last year for similar presentation.  According to EMS, the patient felt ill over the course of the day today and threw up.  He called his neighbor who told him that he should call EMS.  When EMS arrived they say that they observed bedbugs all throughout the patient's bed.  He was minimally responsive, talking to them a little bit but not answering questions or following commands.  One of the paramedics remembers him from a very similar EMS call about a year ago.  The patient denies chest pain and shortness of breath but otherwise will not answer any questions.  He feels warm to the touch and is mildly diaphoretic with dried vomit on his shirt.     Physical Exam   Triage Vital Signs: ED Triage Vitals [08/06/24 2334]  Encounter Vitals Group     BP (!) 135/91     Girls Systolic BP Percentile      Girls Diastolic BP Percentile      Boys Systolic BP Percentile      Boys Diastolic BP Percentile      Pulse      Resp      Temp 97.9 F (36.6 C)     Temp Source Oral     SpO2 98 %     Weight      Height      Head Circumference      Peak Flow      Pain Score      Pain Loc      Pain Education      Exclude from Growth Chart     Most recent vital signs: Vitals:   08/07/24 0530 08/07/24 0648  BP: 128/83 128/86  Pulse: 68 63  Resp: 18 12  Temp:    SpO2: 98% 98%    General: Awake but minimally communicative.  Shaking slightly and feels warm to the touch. CV:  Good peripheral  perfusion.  Regular rate and rhythm with normal heart sounds. Resp:  Normal effort. no accessory muscle usage nor intercostal retractions.  Lungs are clear to auscultation bilaterally. Abd:  No distention.  Patient guards to any palpation of the abdomen throughout the abdomen, no focal tenderness. Other:  Patient has some contracture in his right arm that we are told is chronic and the result of his schizencephaly.  He is responsive to painful stimuli including with that specific arm.  No appreciable focal neurological deficits although he is only minimally able or willing to communicate with the exam.   ED Results / Procedures / Treatments   Labs (all labs ordered are listed, but only abnormal results are displayed) Labs Reviewed  COMPREHENSIVE METABOLIC PANEL WITH GFR - Abnormal; Notable for the following components:      Result Value   Glucose, Bld 117 (*)    All other components within normal limits  BLOOD GAS, VENOUS - Abnormal; Notable for the following components:   pCO2, Ven 42 (*)    pO2, Ven  63 (*)    All other components within normal limits  URINALYSIS, W/ REFLEX TO CULTURE (INFECTION SUSPECTED) - Abnormal; Notable for the following components:   Color, Urine YELLOW (*)    APPearance CLOUDY (*)    Nitrite POSITIVE (*)    Leukocytes,Ua TRACE (*)    Bacteria, UA RARE (*)    All other components within normal limits  URINE DRUG SCREEN, QUALITATIVE ([facility] ONLY) - Abnormal; Notable for the following components:   Cannabinoid 50 Ng, Ur [place] POSITIVE (*)    All other components within normal limits  CBC WITH DIFFERENTIAL/PLATELET - Abnormal; Notable for the following components:   MCV 77.4 (*)    MCH 25.8 (*)    All other components within normal limits  RESP PANEL BY RT-PCR (RSV, FLU A&B, COVID)  [id number]  URINE CULTURE  LACTIC ACID, PLASMA  LIPASE, BLOOD  ETHANOL  PROTIME-INR  CBC WITH DIFFERENTIAL/PLATELET  TROPONIN I (HIGH SENSITIVITY)     EKG  ED ECG REPORT I, Darleene Dome, the attending physician, personally viewed and interpreted this ECG.  Date: 08/06/2024 EKG Time: 23:31 Rate: 76 Rhythm: normal sinus rhythm QRS Axis: normal Intervals: normal ST/T Wave abnormalities: Non-specific ST segment / T-wave changes, but no clear evidence of acute ischemia.  More specifically, he has some generalized ST segment elevation, but is relatively minimal. Narrative Interpretation: no definitive evidence of acute ischemia; does not meet STEMI criteria.    RADIOLOGY See ED course for details   PROCEDURES:  Critical Care performed: No  .1-3 Lead EKG Interpretation  Performed by: Dome Darleene, MD Authorized by: Dome Darleene, MD     Interpretation: normal     ECG rate:  78   ECG rate assessment: normal     Rhythm: sinus rhythm     Ectopy: none     Conduction: normal       IMPRESSION / MDM / ASSESSMENT AND PLAN / ED COURSE  I reviewed the triage vital signs and the nursing notes.                              Differential diagnosis includes, but is not limited to, seizure or postictal state, medication or drug side effect, acute intracranial hemorrhage, sepsis, electrolyte or metabolic derangement.   Patient's presentation is most consistent with acute presentation with potential threat to life or bodily function.  Labs/studies ordered: I ordered standard sepsis labs/studies including the following: respiratory viral panel PCR swab, blood cultures x2, pro time-INR, CMP, urinalysis, urine culture, lactic acid, CBC with differential, high-sensitivity troponin, lipase, 1-view chest x-ray, EKG. also ordered VBG, ethanol level, urine drug screen  Interventions/Medications given:  Medications  lactated ringers  bolus 1,000 mL (0 mLs Intravenous Stopped 08/07/24 0540)  sulfamethoxazole -trimethoprim  (BACTRIM  DS) 800-160 MG per tablet 1 tablet (1 tablet Oral Given 08/07/24 0414)    (Note:  hospital course my include additional interventions and/or  labs/studies not listed above.)   I reviewed the patient's medical record and I see about 3 visits over the last year with very similar presentations.  Of note, multiple times he has had a urinary tract infection that tested nitrite positive.  He has had multiple CT scans of the head that have always been reassuring and stable at his baseline.  I am ordering 1 L of fluids which reportedly has helped him in the past.  I will reassess after he is set up in bed and after blood has  been drawn, but he may benefit from a small dose of droperidol  or other antiemetic.  EKG is reassuring.  The patient is on the cardiac monitor to evaluate for evidence of arrhythmia and/or significant heart rate changes.   Clinical Course as of 08/07/24 9278  Sat Aug 07, 2024  0528 Evaluation and workup took a very long time due to the patient being extremely difficult patient on which to obtain IV access.  However, his labs have been very reassuring with no acute abnormalities, no indication of sepsis, no acute abnormality identified in general except for a positive UA.  This has been the case for him in the past, particularly when he is evaluated with similar symptoms.  He also had positive cannabinoids on the urine drug screen.  Due to drug sensitivities that he has had in the past, I ordered Bactrim  for treatment and he was able to swallow the pill.  I will reassess and see if he is appropriate for discharge given no evidence of an emergent medical condition requiring hospitalization and history of similar issues in the past. [CF]  0533 Patient is more awake and alert now.  He said he feels much better is no longer sick to his stomach, and has no pain.  He said that what he remembers is eating some cookout and then waking up here.  He acknowledges that this has happened multiple times.  He denies any recent burning when he urinates but also acknowledges that he has been diagnosed with urinary tract infections multiple times  in the past.  He admitted to cannabinoid use but denies any other drugs and alcohol usage.  He said that he does not have anybody to call and he is still getting some fluids.  Given the lack of an emergent medical condition, he is comfortable with the plan to go home, and we will likely discharge him in about an hour after his fluids complete and hopefully he will be able to call someone to come pick him up.  He agrees with the plan.  I initially considered hospitalization but given that he has been through this in the past and is always able to follow-up as an outpatient, I think that is very reasonable plan at this time as well. [CF]    Clinical Course User Index [CF] Gordan Huxley, MD     FINAL CLINICAL IMPRESSION(S) / ED DIAGNOSES   Final diagnoses:  Syncope and collapse  Transient alteration of awareness  Acute cystitis without hematuria     Rx / DC Orders   ED Discharge Orders          Ordered    sulfamethoxazole -trimethoprim  (BACTRIM  DS) 800-160 MG tablet  2 times daily        08/07/24 0535             Note:  This document was prepared using Dragon voice recognition software and may include unintentional dictation errors.   Gordan Huxley, MD 08/07/24 [phone]

## 2024-08-07 ENCOUNTER — Emergency Department

## 2024-08-07 ENCOUNTER — Emergency Department
Admission: EM | Admit: 2024-08-07 | Discharge: 2024-08-08 | Disposition: A | Discharge status: Home / Self Care | Attending: Emergency Medicine | Admitting: Emergency Medicine

## 2024-08-07 ENCOUNTER — Encounter: Payer: Self-pay | Admitting: Emergency Medicine

## 2024-08-07 DIAGNOSIS — R112 Nausea with vomiting, unspecified: Secondary | ICD-10-CM | POA: Insufficient documentation

## 2024-08-07 DIAGNOSIS — R1084 Generalized abdominal pain: Secondary | ICD-10-CM | POA: Insufficient documentation

## 2024-08-07 DIAGNOSIS — R55 Syncope and collapse: Secondary | ICD-10-CM | POA: Diagnosis not present

## 2024-08-07 LAB — URINALYSIS, W/ REFLEX TO CULTURE (INFECTION SUSPECTED)
Bilirubin Urine: NEGATIVE
Glucose, UA: NEGATIVE mg/dL
Hgb urine dipstick: NEGATIVE
Ketones, ur: NEGATIVE mg/dL
Nitrite: POSITIVE — AB
Protein, ur: NEGATIVE mg/dL
Specific Gravity, Urine: 1.017 (ref 1.005–1.030)
pH: 7 (ref 5.0–8.0)

## 2024-08-07 LAB — CBC WITH DIFFERENTIAL/PLATELET
Abs Immature Granulocytes: 0.02 K/uL (ref 0.00–0.07)
Basophils Absolute: 0 K/uL (ref 0.0–0.1)
Basophils Relative: 0 %
Eosinophils Absolute: 0 K/uL (ref 0.0–0.5)
Eosinophils Relative: 0 %
HCT: 44.1 % (ref 39.0–52.0)
Hemoglobin: 14.7 g/dL (ref 13.0–17.0)
Immature Granulocytes: 0 %
Lymphocytes Relative: 12 %
Lymphs Abs: 1 K/uL (ref 0.7–4.0)
MCH: 25.8 pg — ABNORMAL LOW (ref 26.0–34.0)
MCHC: 33.3 g/dL (ref 30.0–36.0)
MCV: 77.4 fL — ABNORMAL LOW (ref 80.0–100.0)
Monocytes Absolute: 0.7 K/uL (ref 0.1–1.0)
Monocytes Relative: 9 %
Neutro Abs: 6.4 K/uL (ref 1.7–7.7)
Neutrophils Relative %: 79 %
Platelets: 188 K/uL (ref 150–400)
RBC: 5.7 MIL/uL (ref 4.22–5.81)
RDW: 15.1 % (ref 11.5–15.5)
WBC: 8.1 K/uL (ref 4.0–10.5)
nRBC: 0 % (ref 0.0–0.2)

## 2024-08-07 LAB — RESP PANEL BY RT-PCR (RSV, FLU A&B, COVID)  RVPGX2
Influenza A by PCR: NEGATIVE
Influenza B by PCR: NEGATIVE
Resp Syncytial Virus by PCR: NEGATIVE
SARS Coronavirus 2 by RT PCR: NEGATIVE

## 2024-08-07 LAB — COMPREHENSIVE METABOLIC PANEL WITH GFR
ALT: 33 U/L (ref 0–44)
AST: 22 U/L (ref 15–41)
Albumin: 4 g/dL (ref 3.5–5.0)
Alkaline Phosphatase: 77 U/L (ref 38–126)
Anion gap: 12 (ref 5–15)
BUN: 16 mg/dL (ref 6–20)
CO2: 22 mmol/L (ref 22–32)
Calcium: 9.4 mg/dL (ref 8.9–10.3)
Chloride: 105 mmol/L (ref 98–111)
Creatinine, Ser: 1.19 mg/dL (ref 0.61–1.24)
GFR, Estimated: >60 mL/min (ref >60)
Glucose, Bld: 117 mg/dL — ABNORMAL HIGH (ref 70–99)
Potassium: 4.1 mmol/L (ref 3.5–5.1)
Sodium: 139 mmol/L (ref 135–145)
Total Bilirubin: 0.8 mg/dL (ref 0.0–1.2)
Total Protein: 7.4 g/dL (ref 6.5–8.1)

## 2024-08-07 LAB — LIPASE, BLOOD: Lipase: 29 U/L (ref 11–51)

## 2024-08-07 LAB — URINE DRUG SCREEN, QUALITATIVE (ARMC ONLY)
Amphetamines, Ur Screen: NOT DETECTED
Barbiturates, Ur Screen: NOT DETECTED
Benzodiazepine, Ur Scrn: NOT DETECTED
Cannabinoid 50 Ng, Ur ~~LOC~~: POSITIVE — AB
Cocaine Metabolite,Ur ~~LOC~~: NOT DETECTED
MDMA (Ecstasy)Ur Screen: NOT DETECTED
Methadone Scn, Ur: NOT DETECTED
Opiate, Ur Screen: NOT DETECTED
Phencyclidine (PCP) Ur S: NOT DETECTED
Tricyclic, Ur Screen: NOT DETECTED

## 2024-08-07 LAB — BLOOD GAS, VENOUS
Acid-Base Excess: 0.3 mmol/L (ref 0.0–2.0)
Bicarbonate: 25.4 mmol/L (ref 20.0–28.0)
O2 Saturation: 93.8 %
Patient temperature: 37
pCO2, Ven: 42 mmHg — ABNORMAL LOW (ref 44–60)
pH, Ven: 7.39 (ref 7.25–7.43)
pO2, Ven: 63 mmHg — ABNORMAL HIGH (ref 32–45)

## 2024-08-07 LAB — ETHANOL: Alcohol, Ethyl (B): <15 mg/dL (ref <15)

## 2024-08-07 LAB — PROTIME-INR
INR: 1 (ref 0.8–1.2)
Prothrombin Time: 14.1 s (ref 11.4–15.2)

## 2024-08-07 LAB — LACTIC ACID, PLASMA: Lactic Acid, Venous: 1.5 mmol/L (ref 0.5–1.9)

## 2024-08-07 LAB — TROPONIN I (HIGH SENSITIVITY): Troponin I (High Sensitivity): 6 ng/L (ref <18)

## 2024-08-07 MED ORDER — SULFAMETHOXAZOLE-TRIMETHOPRIM 800-160 MG PO TABS
1.0000 | ORAL_TABLET | Freq: Once | ORAL | Status: AC
  Administered 2024-08-07: 1 via ORAL
  Filled 2024-08-07: qty 1

## 2024-08-07 MED ORDER — SULFAMETHOXAZOLE-TRIMETHOPRIM 800-160 MG PO TABS: 1.0000 | ORAL_TABLET | Freq: Two times a day (BID) | ORAL | 0 refills | Status: AC

## 2024-08-07 NOTE — ED Notes (Signed)
 Lab called again after multiple failed attempts to get blood.

## 2024-08-07 NOTE — ED Notes (Signed)
 Multiple RN's made several attempts to obtain blood from the patient without success. Lab called x 2 to come and attempt to get blood from the patient. Gordan, MD made aware.

## 2024-08-07 NOTE — ED Notes (Signed)
 This RN introduced self to pt. Pt stated he was her for abdominal pain and that was all the info this RN could obtain from pt. Pt hooked up to bedside vital monitoring and given call bell. Instructed to let us  know if he needed anything and that ED provider would be in to see him.   Pt was noted to have bedbugs at his visit yesterday. This RN has not seen any so far this visit.

## 2024-08-07 NOTE — ED Notes (Signed)
 Lab called to report that the CBC was hemolyzed. This RN notified that lab that the phlebotomist had to get the labs that were sent, so they would need to send someone down for a recollect at this time.

## 2024-08-07 NOTE — ED Notes (Signed)
 Lab called after multiple failed attempts to obtain blood from the patient. Per lab, they will send a phlebotomist down.

## 2024-08-07 NOTE — ED Notes (Signed)
 This RN was able to establish and IV, but unable to obtain blood at this time. This RN to ask other staff members to take a look at this time.

## 2024-08-07 NOTE — ED Notes (Signed)
 This RN and multiple other RN's made multiple attempts to get blood at this time and was unsuccessful. Gordan, MD made aware. This RN asked Du[place], Consulting civil engineer to take a look at this time.

## 2024-08-07 NOTE — ED Notes (Signed)
 Phlebotomy at bedside at this time.

## 2024-08-07 NOTE — ED Notes (Signed)
 THIS RN got in report that bedbugs were found on this patient. [facility] called and made aware of pts DC.

## 2024-08-07 NOTE — ED Triage Notes (Addendum)
 Pt to ed from home via [facility] for N/V and abd pain. Pt was seen here yesterday for same. Pt did pick up his bactrim  today and started it. Pt is caox4 per EMS, in no acute distress but doesn't feel better and wanted to come back and get evaluated. Pt is refusing to sign EMS form and refusing to cooperate and answer questions in triage but answered them all for EMS.

## 2024-08-07 NOTE — Discharge Instructions (Signed)
 Your workup in the Emergency Department today was reassuring.  We did not find any specific abnormalities other than you having another urinary tract infection.  We recommend you drink plenty of fluids, take your regular medications and/or any new ones prescribed today, and follow up with the doctor(s) listed in these documents as recommended.  Return to the Emergency Department if you develop new or worsening symptoms that concern you.

## 2024-08-08 ENCOUNTER — Emergency Department

## 2024-08-08 DIAGNOSIS — R55 Syncope and collapse: Secondary | ICD-10-CM | POA: Diagnosis not present

## 2024-08-08 LAB — URINE CULTURE: Culture: NO GROWTH

## 2024-08-08 MED ORDER — DICYCLOMINE HCL 10 MG PO CAPS: 10.0000 mg | ORAL_CAPSULE | Freq: Three times a day (TID) | ORAL | 0 refills | Status: AC | PRN

## 2024-08-08 MED ORDER — METOCLOPRAMIDE HCL 10 MG PO TABS: 10.0000 mg | ORAL_TABLET | Freq: Three times a day (TID) | ORAL | 1 refills | Status: AC | PRN

## 2024-08-08 MED ORDER — DROPERIDOL 2.5 MG/ML IJ SOLN
2.5000 mg | Freq: Once | INTRAMUSCULAR | Status: AC
  Administered 2024-08-08: 2.5 mg via INTRAMUSCULAR
  Filled 2024-08-08: qty 2

## 2024-08-08 MED ORDER — DICYCLOMINE HCL 10 MG PO CAPS
20.0000 mg | ORAL_CAPSULE | Freq: Once | ORAL | Status: AC
  Administered 2024-08-08: 20 mg via ORAL
  Filled 2024-08-08: qty 2

## 2024-08-08 NOTE — ED Notes (Signed)
 CCMD called for cardiac monitoring.

## 2024-08-08 NOTE — ED Provider Notes (Signed)
 [facility] Provider Note    Event Date/Time   First MD Initiated Contact with Patient 08/07/24 2352     (approximate)   History   No chief complaint on file.   HPI Jesus Hawkins is a 58 y.o. male whose history is notable for schizencephaly with some right arm deficits and who has had multiple visits in the past for unexplained syncope and/or nausea/vomiting and abdominal pain.  I saw him last night after he passed out.  He had a reassuring evaluation including CT head and lab work although he was a very difficult patient to access with an IV and his workup was substantially delayed as a result of not being able to draw blood.  His blood work is all reassuring and he was feeling better, awake and alert and back to baseline by morning so he was discharged.  The only abnormality on his workup was a urinary tract infection which she has had multiple times in the past.  I prescribed Bactrim .  He states that he picked up the Bactrim  but he has continued to have generalized abdominal pain with nausea and vomiting throughout the day today.  He states that he no longer smokes marijuana although he used to.  He says it feels similar to what is happened to him in the past.  When I went to evaluate him he was fast asleep but when I woke him up he said that his abdomen is still hurting and he has 2 emesis bags next to him because he says that he still feels nauseated.     Physical Exam   Triage Vital Signs: ED Triage Vitals  Encounter Vitals Group     BP 08/07/24 2223 129/85     Girls Systolic BP Percentile --      Girls Diastolic BP Percentile --      Boys Systolic BP Percentile --      Boys Diastolic BP Percentile --      Pulse Rate 08/07/24 2223 86     Resp 08/07/24 2223 16     Temp 08/07/24 2223 98.6 F (37 C)     Temp Source 08/07/24 2223 Oral     SpO2 08/07/24 2223 100 %     Weight --      Height 08/07/24 2222 1.854 m (6' 1)     Head Circumference --       Peak Flow --      Pain Score 08/07/24 2221 5     Pain Loc --      Pain Education --      Exclude from Growth Chart --     Most recent vital signs: Vitals:   08/08/24 0230 08/08/24 0400  BP: 102/61 92/62  Pulse: (!) 59 61  Resp: 18 16  Temp:    SpO2: 100% 97%    General: Somnolent but will wake up to voice and light touch. CV:  Good peripheral perfusion.  Regular rate and rhythm. Resp:  Normal effort. Speaking easily and comfortably, no accessory muscle usage nor intercostal retractions.   Abd:  Soft with no distention.  Patient guards with any palpation of the abdomen.  No specific localized tenderness.   ED Results / Procedures / Treatments   Labs (all labs ordered are listed, but only abnormal results are displayed) Labs Reviewed - No data to display   RADIOLOGY See ED course for details   PROCEDURES:  Critical Care performed: No  .1-3 Lead EKG Interpretation  Performed by: Gordan Huxley, MD Authorized by: Gordan Huxley, MD     Interpretation: normal     ECG rate:  72   ECG rate assessment: normal     Rhythm: sinus rhythm     Ectopy: none     Conduction: normal       IMPRESSION / MDM / ASSESSMENT AND PLAN / ED COURSE  I reviewed the triage vital signs and the nursing notes.                              Differential diagnosis includes, but is not limited to, cannabinoid hyperemesis syndrome, SBO/ileus  Patient's presentation is most consistent with acute presentation with potential threat to life or bodily function.  Labs/studies ordered: CT abdomen/pelvis  Interventions/Medications given:  Medications  dicyclomine  (BENTYL ) capsule 20 mg (has no administration in time range)  droperidol  (INAPSINE ) 2.5 MG/ML injection 2.5 mg (2.5 mg Intramuscular Given 08/08/24 0041)    (Note:  hospital course my include additional interventions and/or labs/studies not listed above.)   Patient does not appear to be in distress and is sleeping comfortably but  reports persistent pain when he wakes up.  I think cannabinoid hyperemesis is very possible.  Patient received numerous attempts at IV access last night and it was extremely difficult to obtain labs.  Since we have labs within the last 24 hours, I think there is little benefit to repeating them at this time as I do not anticipate they will have changed dramatically.  I talked to the patient about this and he is agreeable to intramuscular medication to help with his symptoms (droperidol  2.5 mg IM) and we we will proceed with a CT of the abdomen and pelvis without contrast to evaluate for the possibility of an emergent condition.  The patient is on the cardiac monitor to evaluate for evidence of arrhythmia and/or significant heart rate changes.   Clinical Course as of 08/08/24 0420  Sun Aug 08, 2024  0155 CT ABDOMEN PELVIS WO CONTRAST I independently viewed and interpreted the patient's abd/pelvis CT, as well as reviewing the radiologist's report.  No evidence of obstruction or ileus or obvious inflammatory changes.  Confirmed by radiologist [CF]  [id number] CT ABDOMEN PELVIS WO CONTRAST I independently viewed and interpreted the patient's abd/pelvis CT, as well as reviewing the radiologist's report.  No evidence of SBO or ileus, no obvious inflammatory process. [CF]  [id number] I woke the patient up and he said that he feels better.  I talked with him about the reassuring CT scan and he said that he understood.  We talked about the need for him to continue taking his antibiotics and I told him I would prescribe him some nausea medicine as well. [CF]    Clinical Course User Index [CF] Gordan Huxley, MD     FINAL CLINICAL IMPRESSION(S) / ED DIAGNOSES   Final diagnoses:  Nausea and vomiting, unspecified vomiting type  Generalized abdominal pain     Rx / DC Orders   ED Discharge Orders          Ordered    metoCLOPramide  (REGLAN ) 10 MG tablet  3 times daily PRN        08/08/24 0419    dicyclomine   (BENTYL ) 10 MG capsule  3 times daily PRN        08/08/24 0419             Note:  This document was prepared  using Conservation officer, historic buildings and may include unintentional dictation errors.   Gordan Huxley, MD 08/08/24 [phone]

## 2024-08-08 NOTE — Discharge Instructions (Signed)
 You have been seen in the Emergency Department (ED) for abdominal pain.  Your evaluation did not identify a clear cause of your symptoms but was generally reassuring.  Please stick with a bland diet and take the medications prescribed to you according to the label instructions.  Please also complete the full course of antibiotics you were prescribed last night.  Please follow up as instructed above regarding today's emergent visit and the symptoms that are bothering you.  Return to the ED if your abdominal pain worsens or fails to improve, you develop bloody vomiting, bloody diarrhea, you are unable to tolerate fluids due to vomiting, fever greater than 101, or other symptoms that concern you.

## 2024-08-08 NOTE — ED Notes (Signed)
 ED Provider at bedside.

## 2024-08-08 NOTE — ED Notes (Signed)
 Pt at CT

## 2024-08-08 NOTE — ED Notes (Signed)
 Pt given discharge paperwork and stated that he didn't have any questions and understood that he had prescriptions to pick up. RN asked pt if he needed a wheelchair to take him to the lobby. Pt stated he did not and that he felt ok to walk out.

## 2024-09-17 ENCOUNTER — Emergency Department

## 2024-09-17 ENCOUNTER — Other Ambulatory Visit: Payer: Self-pay

## 2024-09-17 ENCOUNTER — Emergency Department
Admission: EM | Admit: 2024-09-17 | Discharge: 2024-09-17 | Disposition: A | Discharge status: Home / Self Care | Attending: Emergency Medicine | Admitting: Emergency Medicine

## 2024-09-17 DIAGNOSIS — R079 Chest pain, unspecified: Secondary | ICD-10-CM | POA: Diagnosis not present

## 2024-09-17 DIAGNOSIS — Q740 Other congenital malformations of upper limb(s), including shoulder girdle: Secondary | ICD-10-CM | POA: Diagnosis not present

## 2024-09-17 DIAGNOSIS — R55 Syncope and collapse: Secondary | ICD-10-CM | POA: Diagnosis present

## 2024-09-17 DIAGNOSIS — R42 Dizziness and giddiness: Secondary | ICD-10-CM | POA: Diagnosis present

## 2024-09-17 DIAGNOSIS — Q046 Congenital cerebral cysts: Secondary | ICD-10-CM | POA: Insufficient documentation

## 2024-09-17 DIAGNOSIS — R519 Headache, unspecified: Secondary | ICD-10-CM | POA: Diagnosis present

## 2024-09-17 DIAGNOSIS — I10 Essential (primary) hypertension: Secondary | ICD-10-CM | POA: Insufficient documentation

## 2024-09-17 LAB — BASIC METABOLIC PANEL WITH GFR
Anion gap: 10 (ref 5–15)
BUN: 16 mg/dL (ref 6–20)
CO2: 25 mmol/L (ref 22–32)
Calcium: 9.6 mg/dL (ref 8.9–10.3)
Chloride: 103 mmol/L (ref 98–111)
Creatinine, Ser: 1.04 mg/dL (ref 0.61–1.24)
GFR, Estimated: >60 mL/min (ref >60)
Glucose, Bld: 115 mg/dL — ABNORMAL HIGH (ref 70–99)
Potassium: 4.9 mmol/L (ref 3.5–5.1)
Sodium: 138 mmol/L (ref 135–145)

## 2024-09-17 LAB — CBC
HCT: 45.6 % (ref 39.0–52.0)
Hemoglobin: 15 g/dL (ref 13.0–17.0)
MCH: 25.4 pg — ABNORMAL LOW (ref 26.0–34.0)
MCHC: 32.9 g/dL (ref 30.0–36.0)
MCV: 77.2 fL — ABNORMAL LOW (ref 80.0–100.0)
Platelets: 165 K/uL (ref 150–400)
RBC: 5.91 MIL/uL — ABNORMAL HIGH (ref 4.22–5.81)
RDW: 15 % (ref 11.5–15.5)
WBC: 5.3 K/uL (ref 4.0–10.5)
nRBC: 0 % (ref 0.0–0.2)

## 2024-09-17 MED ORDER — KETOROLAC TROMETHAMINE 15 MG/ML IJ SOLN
15.0000 mg | Freq: Once | INTRAMUSCULAR | Status: AC
  Administered 2024-09-17: 15 mg via INTRAMUSCULAR
  Filled 2024-09-17: qty 1

## 2024-09-17 MED ORDER — METOCLOPRAMIDE HCL 10 MG PO TABS
10.0000 mg | ORAL_TABLET | Freq: Once | ORAL | Status: AC
  Administered 2024-09-17: 10 mg via ORAL
  Filled 2024-09-17: qty 1

## 2024-09-17 MED ORDER — SODIUM CHLORIDE 0.9 % IV BOLUS
1000.0000 mL | Freq: Once | INTRAVENOUS | Status: AC
  Administered 2024-09-17: 1000 mL via INTRAVENOUS

## 2024-09-17 MED ORDER — FAMOTIDINE 20 MG PO TABS
40.0000 mg | ORAL_TABLET | Freq: Once | ORAL | Status: AC
  Administered 2024-09-17: 40 mg via ORAL
  Filled 2024-09-17: qty 2

## 2024-09-17 NOTE — ED Provider Notes (Signed)
-----------------------------------------   8:13 PM on 09/17/2024 -----------------------------------------  Blood pressure 129/87, pulse 80, temperature 97.8 F (36.6 C), temperature source Oral, resp. rate 18, height 6' 1 (1.854 m), weight 90.7 kg, SpO2 98%.  Assuming care from Dr. Viviann.  In short, Jesus Hawkins is a 58 y.o. male with a chief complaint of Headache .  Refer to the original H&P for additional details.  The current plan of care is to await MRI results and treat as indicated..  Orthostatic vital signs are reassuring.  RN reports that patient is much more stable upon standing.  MRI results are negative for acute concerns.  Patient reports that the headache has improved.  Negative MRI results discussed with the patient.  He feels comfortable with plan of discharge.  Symptoms likely related to viral process.  Plan is to discharge home with instructions to follow-up with primary care if symptoms are not improving over the next few days.  He was encouraged to return to the emergency department for symptoms of change or worsen if unable to schedule an appointment.    Herlinda Kirk NOVAK, FNP 09/17/24 2022    Viviann Pastor, MD 09/18/24 2230

## 2024-09-17 NOTE — ED Provider Notes (Signed)
 [facility] Provider Note    Event Date/Time   First MD Initiated Contact with Patient 09/17/24 1411     (approximate)   History   Chief Complaint: Headache   HPI  Jesus Hawkins is a 58 y.o. male with a history of hypertension, chronic right arm disability who comes ED complaining of bilateral frontal headache, lightheadedness, nausea, fatigue, chills that all started yesterday.  Gradual onset, waxing and waning.  No acute vision change, no trauma or fever or neck stiffness.  Denies sick contacts.        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    Current Outpatient Rx   Order #: [id number] Class: Historical Med   Order #: [id number] Class: Historical Med   Order #: [id number] Class: Normal   Order #: [id number] Class: Normal   Order #: [id number] Class: Normal   Order #: [id number] Class: Historical Med   Order #: [id number] Class: Normal   Order #: [id number] Class: Historical Med   Order #: [id number] Class: Historical Med   Order #: [id number] Class: Normal   Order #: [id number] Class: Normal   Order #: [id number] Class: Normal    Past Surgical History:  Procedure Laterality Date   COLONOSCOPY WITH PROPOFOL  N/A 10/13/2015   Procedure: COLONOSCOPY WITH PROPOFOL ;  Surgeon: Gladis RAYMOND Mariner, MD;  Location: [facility] ENDOSCOPY;  Service: Endoscopy;  Laterality: N/A;   SHOULDER SURGERY Right    TOE FUSION      Physical Exam   Triage Vital Signs: ED Triage Vitals  Encounter Vitals Group     BP 09/17/24 1343 (!) 140/96     Girls Systolic BP Percentile --      Girls Diastolic BP Percentile --      Boys Systolic BP Percentile --      Boys Diastolic BP Percentile --      Pulse Rate 09/17/24 1343 77     Resp 09/17/24 1343 18     Temp 09/17/24 1343 97.9 F (36.6 C)     Temp src --      SpO2 09/17/24 1343 99 %     Weight 09/17/24 1344 200 lb (90.7 kg)      Height 09/17/24 1344 6' 1 (1.854 m)     Head Circumference --      Peak Flow --      Pain Score 09/17/24 1343 8     Pain Loc --      Pain Education --      Exclude from Growth Chart --     Most recent vital signs: Vitals:   09/17/24 1343  BP: (!) 140/96  Pulse: 77  Resp: 18  Temp: 97.9 F (36.6 C)  SpO2: 99%    General: Awake, no distress.  CV:  Good peripheral perfusion.  Regular rate rhythm Resp:  Normal effort.  Clear lungs Abd:  No distention.  Soft nontender Other:  Moist oral mucosa.  No meningismus.  Cranial nerves III through XII intact.   ED Results / Procedures / Treatments   Labs (all labs ordered are listed, but only abnormal results are displayed) Labs Reviewed  CBC - Abnormal; Notable for the following components:      Result Value   RBC 5.91 (*)    MCV 77.2 (*)    MCH 25.4 (*)    All other components within normal limits  BASIC METABOLIC PANEL WITH GFR -  Abnormal; Notable for the following components:   Glucose, Bld 115 (*)    All other components within normal limits     EKG Interpreted by me Sinus rhythm rate of 73.  Normal axis intervals QRS ST segments T waves   RADIOLOGY CT head interpreted by me, no acute hemorrhage.  Radiology report reviewed   PROCEDURES:  Procedures   MEDICATIONS ORDERED IN ED: Medications  ketorolac  (TORADOL ) 15 MG/ML injection 15 mg (15 mg Intramuscular Given 09/17/24 1452)  metoCLOPramide  (REGLAN ) tablet 10 mg (10 mg Oral Given 09/17/24 1453)  famotidine (PEPCID) tablet 40 mg (40 mg Oral Given 09/17/24 1453)  sodium chloride  0.9 % bolus 1,000 mL (1,000 mLs Intravenous New Bag/Given 09/17/24 1548)     IMPRESSION / MDM / ASSESSMENT AND PLAN / ED COURSE  I reviewed the triage vital signs and the nursing notes.  DDx: Influenza-like illness, dehydration, AKI, electrolyte derangement, anemia, intracranial hemorrhage.  Doubt meningitis, stroke, cerebral aneurysm, arterial dissection, intracranial hypertension,  glaucoma, CRVO  Patient's presentation is most consistent with acute presentation with potential threat to life or bodily function.  Patient presents with constellation of symptoms highly suspicious for an influenza-like illness.  Will obtain labs, CT head, provide supportive care.   Clinical Course as of 09/17/24 1936  Fri Sep 17, 2024  1532 No improvement with medication so far.  Tried to walk to the bathroom, was quite ataxic.  CT head negative, will obtain MRI, give IV fluids. [PS]    Clinical Course User Index [PS] Viviann Pastor, MD     FINAL CLINICAL IMPRESSION(S) / ED DIAGNOSES   Final diagnoses:  Dizziness     Rx / DC Orders   ED Discharge Orders     None        Note:  This document was prepared using Dragon voice recognition software and may include unintentional dictation errors.   Viviann Pastor, MD 09/17/24 [phone]

## 2024-09-17 NOTE — ED Triage Notes (Signed)
 Pt to ED [facility] from home for h/a, dizziness started today.  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

## 2024-09-17 NOTE — Discharge Instructions (Signed)
 Please follow-up with your primary care provider if not improving over the next 2 days.  Return to the emergency department if symptoms change or worsen.

## 2024-09-17 NOTE — ED Notes (Signed)
 Pt noted to have a steady, even gait at this time.   Pt provided discharge instructions and prescription information. Pt was given the opportunity to ask questions and questions were answered.

## 2024-10-11 ENCOUNTER — Encounter: Payer: Self-pay | Admitting: Emergency Medicine

## 2024-10-11 ENCOUNTER — Emergency Department
Admission: EM | Admit: 2024-10-11 | Discharge: 2024-10-11 | Disposition: A | Discharge status: Home / Self Care | Attending: Emergency Medicine | Admitting: Emergency Medicine

## 2024-10-11 ENCOUNTER — Other Ambulatory Visit: Payer: Self-pay

## 2024-10-11 DIAGNOSIS — R42 Dizziness and giddiness: Secondary | ICD-10-CM | POA: Insufficient documentation

## 2024-10-11 LAB — COMPREHENSIVE METABOLIC PANEL WITH GFR
ALT: 34 U/L (ref 0–44)
AST: 25 U/L (ref 15–41)
Albumin: 3.8 g/dL (ref 3.5–5.0)
Alkaline Phosphatase: 79 U/L (ref 38–126)
Anion gap: 9 (ref 5–15)
BUN: 13 mg/dL (ref 6–20)
CO2: 24 mmol/L (ref 22–32)
Calcium: 9 mg/dL (ref 8.9–10.3)
Chloride: 104 mmol/L (ref 98–111)
Creatinine, Ser: 1.09 mg/dL (ref 0.61–1.24)
GFR, Estimated: >60 mL/min (ref >60)
Glucose, Bld: 105 mg/dL — ABNORMAL HIGH (ref 70–99)
Potassium: 3.8 mmol/L (ref 3.5–5.1)
Sodium: 137 mmol/L (ref 135–145)
Total Bilirubin: 0.7 mg/dL (ref 0.0–1.2)
Total Protein: 7.1 g/dL (ref 6.5–8.1)

## 2024-10-11 LAB — CBC WITH DIFFERENTIAL/PLATELET
Abs Immature Granulocytes: 0.01 K/uL (ref 0.00–0.07)
Basophils Absolute: 0 K/uL (ref 0.0–0.1)
Basophils Relative: 1 %
Eosinophils Absolute: 0.1 K/uL (ref 0.0–0.5)
Eosinophils Relative: 1 %
HCT: 42.3 % (ref 39.0–52.0)
Hemoglobin: 14 g/dL (ref 13.0–17.0)
Immature Granulocytes: 0 %
Lymphocytes Relative: 22 %
Lymphs Abs: 1.3 K/uL (ref 0.7–4.0)
MCH: 25.5 pg — ABNORMAL LOW (ref 26.0–34.0)
MCHC: 33.1 g/dL (ref 30.0–36.0)
MCV: 77 fL — ABNORMAL LOW (ref 80.0–100.0)
Monocytes Absolute: 0.7 K/uL (ref 0.1–1.0)
Monocytes Relative: 12 %
Neutro Abs: 3.7 K/uL (ref 1.7–7.7)
Neutrophils Relative %: 64 %
Platelets: 167 K/uL (ref 150–400)
RBC: 5.49 MIL/uL (ref 4.22–5.81)
RDW: 15.1 % (ref 11.5–15.5)
WBC: 5.8 K/uL (ref 4.0–10.5)
nRBC: 0 % (ref 0.0–0.2)

## 2024-10-11 MED ORDER — SODIUM CHLORIDE 0.9 % IV BOLUS
1000.0000 mL | Freq: Once | INTRAVENOUS | Status: AC
  Administered 2024-10-11: 1000 mL via INTRAVENOUS

## 2024-10-11 MED ORDER — MECLIZINE HCL 25 MG PO TABS: 25.0000 mg | ORAL_TABLET | Freq: Three times a day (TID) | ORAL | 0 refills | Status: AC | PRN

## 2024-10-11 MED ORDER — MECLIZINE HCL 25 MG PO TABS
25.0000 mg | ORAL_TABLET | Freq: Once | ORAL | Status: AC
  Administered 2024-10-11: 25 mg via ORAL
  Filled 2024-10-11: qty 1

## 2024-10-11 NOTE — ED Notes (Signed)
 Pt reports still feeling a little dizzy. Antivert  given.

## 2024-10-11 NOTE — ED Provider Notes (Signed)
 [facility] Provider Note    Event Date/Time   First MD Initiated Contact with Patient 10/11/24 1810     (approximate)   History   Dizziness   HPI  Jesus Hawkins is a 58 y.o. male  \who presents to the emergency department today with primary concern for dizziness.  States that the symptoms started today.  The dizziness is worse when he stands up.  He denies any chest pain palpitations or shortness of breath with this.  He denies any recent fevers or chills.  Per chart review was seen for similar symptoms last month, had MRI performed at that time without concerning abnormality.     Physical Exam   Triage Vital Signs: ED Triage Vitals  Encounter Vitals Group     BP 10/11/24 1814 131/83     Girls Systolic BP Percentile --      Girls Diastolic BP Percentile --      Boys Systolic BP Percentile --      Boys Diastolic BP Percentile --      Pulse Rate 10/11/24 1814 71     Resp 10/11/24 1814 16     Temp 10/11/24 1814 97.8 F (36.6 C)     Temp src --      SpO2 10/11/24 1814 96 %     Weight 10/11/24 1816 200 lb 9.9 oz (91 kg)     Height 10/11/24 1816 6' 1 (1.854 m)     Head Circumference --      Peak Flow --      Pain Score 10/11/24 1816 0     Pain Loc --      Pain Education --      Exclude from Growth Chart --     Most recent vital signs: Vitals:   10/11/24 1815 10/11/24 1830  BP: 131/83 136/83  Pulse: 74 71  Resp: 14 17  Temp:    SpO2: 98% 100%   General: Awake, alert, oriented. CV:  Good peripheral perfusion. Regular rate and rhythm. Resp:  Normal effort. Lungs clear. Abd:  No distention.   ED Results / Procedures / Treatments   Labs (all labs ordered are listed, but only abnormal results are displayed) Labs Reviewed  CBC WITH DIFFERENTIAL/PLATELET - Abnormal; Notable for the following components:      Result Value   MCV 77.0 (*)    MCH 25.5 (*)    All other components within normal limits  COMPREHENSIVE METABOLIC PANEL WITH GFR -  Abnormal; Notable for the following components:   Glucose, Bld 105 (*)    All other components within normal limits     EKG  I, Guadalupe Eagles, attending physician, personally viewed and interpreted this EKG  EKG Time: 1815 Rate: 69 Rhythm: sinus rhythm Axis: normal Intervals: qtc 391 QRS: narrow ST changes: no st elevation Impression: normal ekg   RADIOLOGY None   PROCEDURES:  Critical Care performed: No   MEDICATIONS ORDERED IN ED: Medications - No data to display   IMPRESSION / MDM / ASSESSMENT AND PLAN / ED COURSE  I reviewed the triage vital signs and the nursing notes.                              Differential diagnosis includes, but is not limited to, anemia, electrolyte abnormality, vertigo  Patient's presentation is most consistent with acute presentation with potential threat to life or bodily function.  Patient presented  to the emergency department today because of concerns for dizziness.  On exam patient is awake alert and oriented.  Blood work without concerning anemia.  Potassium was slightly low.  Given recent MRI and similar symptoms I do not think CVA likely and do not feel repeat MRI is necessary at this time.  Patient did feel better after fluids and meclizine .  Do wonder if patient suffers from vertigo.  Will give patient prescription for meclizine .    FINAL CLINICAL IMPRESSION(S) / ED DIAGNOSES   Final diagnoses:  Dizziness        Note:  This document was prepared using Dragon voice recognition software and may include unintentional dictation errors.    Floy Roberts, MD 10/12/24 [phone]
